# Patient Record
Sex: Female | Born: 1990 | Race: Black or African American | Hispanic: No | Marital: Single | Smoking: Never smoker
Health system: Southern US, Community
[De-identification: ages and names within clinical notes are randomized; demographics above are authoritative.]

---

## 2013-06-14 ENCOUNTER — Emergency Department (HOSPITAL_COMMUNITY)
Admission: EM | Admit: 2013-06-14 | Discharge: 2013-06-14 | Disposition: A | Discharge status: Home / Self Care | Payer: Self-pay | Attending: Emergency Medicine | Admitting: Emergency Medicine

## 2013-06-14 ENCOUNTER — Encounter (HOSPITAL_COMMUNITY): Payer: Self-pay | Admitting: *Deleted

## 2013-06-14 ENCOUNTER — Emergency Department (HOSPITAL_COMMUNITY): Payer: Self-pay

## 2013-06-14 DIAGNOSIS — Y9239 Other specified sports and athletic area as the place of occurrence of the external cause: Secondary | ICD-10-CM | POA: Insufficient documentation

## 2013-06-14 DIAGNOSIS — S92309A Fracture of unspecified metatarsal bone(s), unspecified foot, initial encounter for closed fracture: Secondary | ICD-10-CM | POA: Insufficient documentation

## 2013-06-14 DIAGNOSIS — X500XXA Overexertion from strenuous movement or load, initial encounter: Secondary | ICD-10-CM | POA: Insufficient documentation

## 2013-06-14 DIAGNOSIS — Y9367 Activity, basketball: Secondary | ICD-10-CM | POA: Insufficient documentation

## 2013-06-14 DIAGNOSIS — S92352A Displaced fracture of fifth metatarsal bone, left foot, initial encounter for closed fracture: Secondary | ICD-10-CM

## 2013-06-14 MED ORDER — TRAMADOL HCL 50 MG PO TABS: 50.0000 mg | ORAL_TABLET | Freq: Four times a day (QID) | ORAL | Status: DC | PRN

## 2013-06-14 NOTE — ED Notes (Signed)
Ortho paged. 

## 2013-06-14 NOTE — ED Provider Notes (Signed)
Medical screening examination/treatment/procedure(s) were performed by non-physician practitioner and as supervising physician I was immediately available for consultation/collaboration.   Glynn Octave, MD 06/14/13 2126

## 2013-06-14 NOTE — ED Provider Notes (Signed)
CSN: [id number]     Arrival date & time 06/14/13  1922 History   First MD Initiated Contact with Patient 06/14/13 1932     Chief Complaint  Patient presents with  . Foot Injury   (Consider location/radiation/quality/duration/timing/severity/associated sxs/prior Treatment) HPI Comments: Patient is a 22 year old female who presents with a 3 day history of left foot pain. The mechanism of injury was sudden ankle inversion during basketball practice. Patient reports sudden onset of throbbing, severe pain that is localized to left lateral foot. Patient reports progressive worsening of pain. Ankle movement and weight bearing activity make the pain worse. Nothing makes the pain better. Patient reports associated swelling. She reports not being able to bear weight on the affected foot. Patient has not tried anything for pain relief. Patient denies obvious deformity, numbness/tingling, coolness/weakness of extremity, bruising, and any other injury.      History reviewed. No pertinent past medical history. History reviewed. No pertinent past surgical history. No family history on file. History  Substance Use Topics  . Smoking status: Never Smoker   . Smokeless tobacco: Not on file  . Alcohol Use: No   OB History   Grav Para Term Preterm Abortions TAB SAB Ect Mult Living                 Review of Systems  Musculoskeletal: Positive for arthralgias.  All other systems reviewed and are negative.    Allergies  Review of patient's allergies indicates not on file.  Home Medications  No current outpatient prescriptions on file. BP 151/92  Pulse 68  Temp(Src) 97.9 F (36.6 C) (Oral)  Resp 16  SpO2 100%  LMP 06/07/2013 Physical Exam  Nursing note and vitals reviewed. Constitutional: She appears well-developed and well-nourished. No distress.  HENT:  Head: Normocephalic and atraumatic.  Eyes: Conjunctivae are normal.  Neck: Normal range of motion.  Cardiovascular: Normal rate, regular  rhythm and intact distal pulses.  Exam reveals no gallop and no friction rub.   No murmur heard. Pulmonary/Chest: Effort normal and breath sounds normal. She has no wheezes. She has no rales. She exhibits no tenderness.  Musculoskeletal: Normal range of motion.  Left lateral foot swelling and tenderness to palpation over 5th proximal metatarsal bone.   Neurological: She is alert. Coordination normal.  Speech is goal-oriented. Moves limbs without ataxia.   Skin: Skin is warm and dry.  Psychiatric: She has a normal mood and affect. Her behavior is normal.    ED Course  Procedures (including critical care time) Labs Review Labs Reviewed - No data to display Imaging Review Dg Foot Complete Left  06/14/2013   *RADIOLOGY REPORT*  Clinical Data: Lateral foot pain.  Jumping injury 3 days ago.  LEFT FOOT - COMPLETE 3+ VIEW  Comparison: None.  Findings: There is a Jones fracture of the fifth metatarsal which is nondisplaced.  No distraction of the fracture.  This is better seen on the oblique view than on the frontal view.  There is lateral soft tissue swelling, pronounced over the lateral fifth MTP joint.  IMPRESSION: Fifth metatarsal Jones fracture.   Original Report Authenticated By: Andreas Newport, M.D.    MDM   1. Fracture of fifth metatarsal bone of left foot     7:32 PM Foot xray pending. No neurovascular compromise. Patient will have Tramadol for pain.   8:15 PM Patient's xray shows fifth metatarsal jones fracture. Patient will have crutches, posterior ankle splint and follow up with [facility]. No neurovascular compromise.  Emilia Beck, PA-C 06/14/13 2022

## 2013-06-14 NOTE — ED Notes (Signed)
The pt injured her lt foot Wednesday playing basketball.  Pain since then she walked in

## 2013-06-14 NOTE — Progress Notes (Signed)
Orthopedic Tech Progress Note Patient Details:  Beth Lindsey 1991-06-28 [id number]  Ortho Devices Type of Ortho Device: Ace wrap;Crutches;Post (short leg) splint Ortho Device/Splint Location: LLE Ortho Device/Splint Interventions: Ordered;Application   [name] 06/14/2013, 8:40 PM

## 2013-08-18 ENCOUNTER — Encounter (HOSPITAL_COMMUNITY): Payer: Self-pay | Admitting: Emergency Medicine

## 2013-08-18 ENCOUNTER — Emergency Department (HOSPITAL_COMMUNITY)
Admission: EM | Admit: 2013-08-18 | Discharge: 2013-08-18 | Disposition: A | Discharge status: Home / Self Care | Payer: Self-pay | Attending: Emergency Medicine | Admitting: Emergency Medicine

## 2013-08-18 DIAGNOSIS — R05 Cough: Secondary | ICD-10-CM | POA: Insufficient documentation

## 2013-08-18 DIAGNOSIS — Z79899 Other long term (current) drug therapy: Secondary | ICD-10-CM | POA: Insufficient documentation

## 2013-08-18 DIAGNOSIS — R059 Cough, unspecified: Secondary | ICD-10-CM | POA: Insufficient documentation

## 2013-08-18 MED ORDER — ALBUTEROL SULFATE HFA 108 (90 BASE) MCG/ACT IN AERS
2.0000 | INHALATION_SPRAY | Freq: Once | RESPIRATORY_TRACT | Status: AC
  Administered 2013-08-18: 2 via RESPIRATORY_TRACT
  Filled 2013-08-18: qty 6.7

## 2013-08-18 NOTE — ED Provider Notes (Signed)
CSN: [id number]     Arrival date & time 08/18/13  1848 History   None    This chart was scribed for Clyde Canterbury, by Ladona Ridgel Day, ED scribe. This patient was seen in room TR04C/TR04C and the patient's care was started at 1848.  Chief Complaint  Patient presents with  . Cough   Patient is a 22 y.o. female presenting with cough. The history is provided by the patient. No language interpreter was used.  Cough Cough characteristics:  Harsh Severity:  Moderate Onset quality:  Gradual Duration:  8 weeks Timing:  Constant Progression:  Worsening Chronicity:  New Smoker: no   Context: smoke exposure   Relieved by:  Nothing Worsened by:  Nothing tried Associated symptoms: no chills, no fever and no shortness of breath    HPI Comments: Beth Lindsey is a 22 y.o. female who presents to the Emergency Department complaining of a constant, gradually worsening harsh cough, onset 2 months ago. She states tried taking OTC medicines w/out improvement. She states has not tried using an inhaler for these symptoms. She has not yet seen anyone for this problem. She denies fever/chills, SOB. She states second hand smoke at home but is not a smoker herself.   History reviewed. No pertinent past medical history. History reviewed. No pertinent past surgical history. No family history on file. History  Substance Use Topics  . Smoking status: Never Smoker   . Smokeless tobacco: Not on file  . Alcohol Use: No   OB History   Grav Para Term Preterm Abortions TAB SAB Ect Mult Living                 Review of Systems  Constitutional: Negative for fever and chills.  Respiratory: Positive for cough. Negative for shortness of breath.   Gastrointestinal: Negative for nausea and vomiting.  Neurological: Negative for weakness.  All other systems reviewed and are negative.   A complete 10 system review of systems was obtained and all systems are negative except as noted in the HPI and PMH.   Allergies   Review of patient's allergies indicates no known allergies.  Home Medications   Current Outpatient Rx  Name  Route  Sig  Dispense  Refill  . ibuprofen (ADVIL,MOTRIN) 200 MG tablet   Oral   Take 400 mg by mouth every 6 (six) hours as needed for pain.         . Pseudoeph-Doxylamine-DM-APAP (NYQUIL PO)   Oral   Take 1 capsule by mouth daily.         . traMADol (ULTRAM) 50 MG tablet   Oral   Take 1 tablet (50 mg total) by mouth every 6 (six) hours as needed for pain.   20 tablet   0    Triage Vitals: BP 120/79  Pulse 75  Temp(Src) 97.8 F (36.6 C) (Oral)  Resp 16  Ht 6\' 1"  (1.854 m)  Wt 135 lb (61.236 kg)  BMI 17.82 kg/m2  SpO2 100%  LMP 08/01/2013 Physical Exam  Nursing note and vitals reviewed. Constitutional: She is oriented to person, place, and time. She appears well-developed and well-nourished. No distress.  HENT:  Head: Normocephalic and atraumatic.  Eyes: EOM are normal.  Neck: Neck supple. No tracheal deviation present.  Cardiovascular: Normal rate, regular rhythm and normal heart sounds.   No murmur heard. Pulmonary/Chest: Effort normal. No respiratory distress.  Musculoskeletal: Normal range of motion.  Neurological: She is alert and oriented to person, place, and time.  Skin: Skin is warm and dry.  Psychiatric: She has a normal mood and affect. Her behavior is normal.    ED Course  Procedures (including critical care time) DIAGNOSTIC STUDIES: Oxygen Saturation is 100% on room air, normal by my interpretation.    COORDINATION OF CARE: At 810 PM Discussed treatment plan with patient which includes proventil. Patient agrees.   Labs Review Labs Reviewed - No data to display Imaging Review No results found.  EKG Interpretation   None       MDM   1. Cough    No fever, cough, chills, recent illness, difficulty breathing or shortness of breath. Well-appearing and benign exam. No history of asthma. Describes this cough as feeling irritated  and worsening at night time. Encouraged to use humidifier at night and use of albuterol inhaler as needed. Follow-up with [facility].   I personally performed the services described in this documentation, which was scribed in my presence. The recorded information has been reviewed and is accurate.      Irish Elders, NP 08/19/13 [phone]

## 2013-08-18 NOTE — ED Notes (Signed)
Pt states she has had a cough for 2 months and has tried nyquil. States that it did not help. States that she coughs up a small amount of mucous.

## 2013-08-18 NOTE — ED Notes (Signed)
The pt is c/o a cough for 2 months.  No temp taking over-the-counter meds that are not helping

## 2013-08-19 NOTE — ED Provider Notes (Signed)
Medical screening examination/treatment/procedure(s) were performed by non-physician practitioner and as supervising physician I was immediately available for consultation/collaboration.  EKG Interpretation   None         Josette Shimabukuro E Edman Lipsey, MD 08/19/13 1542 

## 2013-08-27 ENCOUNTER — Emergency Department (HOSPITAL_COMMUNITY): Payer: Self-pay

## 2013-08-27 ENCOUNTER — Encounter (HOSPITAL_COMMUNITY): Payer: Self-pay | Admitting: Emergency Medicine

## 2013-08-27 ENCOUNTER — Emergency Department (HOSPITAL_COMMUNITY)
Admission: EM | Admit: 2013-08-27 | Discharge: 2013-08-27 | Disposition: A | Discharge status: Home / Self Care | Payer: Self-pay | Attending: Emergency Medicine | Admitting: Emergency Medicine

## 2013-08-27 DIAGNOSIS — IMO0001 Reserved for inherently not codable concepts without codable children: Secondary | ICD-10-CM | POA: Insufficient documentation

## 2013-08-27 DIAGNOSIS — Z79899 Other long term (current) drug therapy: Secondary | ICD-10-CM | POA: Insufficient documentation

## 2013-08-27 DIAGNOSIS — X500XXA Overexertion from strenuous movement or load, initial encounter: Secondary | ICD-10-CM | POA: Insufficient documentation

## 2013-08-27 DIAGNOSIS — S8990XA Unspecified injury of unspecified lower leg, initial encounter: Secondary | ICD-10-CM | POA: Insufficient documentation

## 2013-08-27 DIAGNOSIS — Y9367 Activity, basketball: Secondary | ICD-10-CM | POA: Insufficient documentation

## 2013-08-27 DIAGNOSIS — Y9239 Other specified sports and athletic area as the place of occurrence of the external cause: Secondary | ICD-10-CM | POA: Insufficient documentation

## 2013-08-27 DIAGNOSIS — S92302D Fracture of unspecified metatarsal bone(s), left foot, subsequent encounter for fracture with routine healing: Secondary | ICD-10-CM

## 2013-08-27 DIAGNOSIS — M79672 Pain in left foot: Secondary | ICD-10-CM

## 2013-08-27 NOTE — ED Notes (Signed)
Patient fractured her foot in august.  States that she was just cleared and injured her foot again yesterday. C/O pain and swelling in her left lateral foot.  RN noted swelling in her left lateral foot.  CMS intact

## 2013-08-27 NOTE — ED Provider Notes (Signed)
CSN: [id number]     Arrival date & time 08/27/13  1806 History   First MD Initiated Contact with Patient 08/27/13 2041    This chart was scribed for Dahlia Client Muthersbaug PA-C, a non-physician practitioner working with Enid Skeens, MD by Lewanda Rife, ED Scribe. This patient was seen in room TR08C/TR08C and the patient's care was started at 9:37 PM     Chief Complaint  Patient presents with  . Foot Pain   (Consider location/radiation/quality/duration/timing/severity/associated sxs/prior Treatment) The history is provided by the patient and medical records. No language interpreter was used.   HPI Comments: Beth Lindsey is a 22 y.o. female who presents to the Emergency Department complaining of intermittent moderate lateral left foot pain onset yesterday after "landing wrong" while playing basketball. Denies associated recent fall, other injuries, numbness,  Reports pain is exacerbated by weight bearing. Reports pain is alleviated by ibuprofen. Reports PMHx of fifth metatarsal fx of left foot in 05/2013, she was cleared by orthopedist to return to basketball. The patient reports she presents today because of the pain. She states the pain is less than when she initially for her foot.   History reviewed. No pertinent past medical history. History reviewed. No pertinent past surgical history. History reviewed. No pertinent family history. History  Substance Use Topics  . Smoking status: Never Smoker   . Smokeless tobacco: Not on file  . Alcohol Use: No   OB History   Grav Para Term Preterm Abortions TAB SAB Ect Mult Living                 Review of Systems  Constitutional: Negative for fever and chills.  Gastrointestinal: Negative for nausea and vomiting.  Musculoskeletal: Positive for arthralgias and gait problem (2/2 pain). Negative for back pain, joint swelling, neck pain and neck stiffness.       Foot pain   Skin: Negative for wound.  Neurological: Negative for  numbness.  Hematological: Does not bruise/bleed easily.  Psychiatric/Behavioral: The patient is not nervous/anxious.   All other systems reviewed and are negative.   A complete 10 system review of systems was obtained and all systems are negative except as noted in the HPI and PMHx.    Allergies  Review of patient's allergies indicates no known allergies.  Home Medications   Current Outpatient Rx  Name  Route  Sig  Dispense  Refill  . albuterol (PROVENTIL HFA;VENTOLIN HFA) 108 (90 BASE) MCG/ACT inhaler   Inhalation   Inhale 2 puffs into the lungs every 6 (six) hours as needed for wheezing or shortness of breath.         Marland Kitchen ibuprofen (ADVIL,MOTRIN) 200 MG tablet   Oral   Take 400 mg by mouth every 6 (six) hours as needed for pain.          BP 115/72  Pulse 60  Temp(Src) 98 F (36.7 C) (Oral)  Resp 18  Ht 6' (1.829 m)  Wt 131 lb 12.8 oz (59.784 kg)  BMI 17.87 kg/m2  SpO2 99%  LMP 08/27/2013 Physical Exam  Nursing note and vitals reviewed. Constitutional: She is oriented to person, place, and time. She appears well-developed and well-nourished. No distress.  Awake, alert, nontoxic appearance  HENT:  Head: Normocephalic and atraumatic.  Mouth/Throat: Oropharynx is clear and moist. No oropharyngeal exudate.  Eyes: Conjunctivae are normal. No scleral icterus.  Neck: Normal range of motion. Neck supple.  Cardiovascular: Normal rate, regular rhythm, normal heart sounds and intact distal pulses.  No murmur heard. Capillary refill less than 3 seconds  Pulmonary/Chest: Effort normal and breath sounds normal. No respiratory distress. She has no wheezes.  Musculoskeletal: Normal range of motion. She exhibits no edema and no tenderness.  ROM: Will range of motion of all joints of the left lower extremity  Mild swelling of the left fifth metatarsal with mild ecchymosis - pt reports unchanged since initial break  Neurological: She is alert and oriented to person, place, and  time. She has normal strength. No sensory deficit. Coordination normal.  Speech is clear and goal oriented Moves extremities without ataxia  5/5 Strength of left foot including dorsiflexion and plantar flexion against resistance Sensation intact    Skin: Skin is warm and dry. She is not diaphoretic. No erythema.  No tenting of the skin  Psychiatric: She has a normal mood and affect.    ED Course  Procedures (including critical care time)  COORDINATION OF CARE:  Nursing notes reviewed. Vital signs reviewed. Initial pt interview and examination performed.   9:37 PM Nursing Notes Reviewed/ Care Coordinated Applicable Imaging Reviewed and incorporated into ED treatment Discussed results and treatment plan with pt. Pt demonstrates understanding and agrees with plan.   Treatment plan initiated:Medications - No data to display   Initial diagnostic testing ordered.    Labs Review Labs Reviewed - No data to display Imaging Review Dg Foot Complete Left  08/27/2013   CLINICAL DATA:  Pain, history of 5th metatarsal fracture  EXAM: LEFT FOOT - COMPLETE 3+ VIEW  COMPARISON:  06/14/2013  FINDINGS: Healing fracture involving the proximal 5th metatarsal. Fracture lucency remains visible.  The joint spaces are preserved.  Visualized soft tissues are grossly unremarkable.  IMPRESSION: Healing fracture involving the proximal 5th metatarsal. Fracture lucency remains visible.   Electronically Signed   By: Charline Bills M.D.   On: 08/27/2013 19:16    EKG Interpretation   None       MDM   1. Foot pain, left   2. Fracture of 5th metatarsal, left, with routine healing, subsequent encounter      Beth Lindsey presents with foot pain after playing basketball.  Patient X-Ray negative for obvious new fracture or dislocation. Healing 5th metatarsal fractured noted.  I personally reviewed the imaging tests through PACS system. I reviewed available ER/hospitalization records through the EMR.   Pain managed in ED. Pt advised to follow up with orthopedics if symptoms persist. Patient instructed to wear her postop shoe for one more week, conservative therapy recommended and discussed. Patient will be dc home & is agreeable with above plan.  She reports she has pain medication at home.  It has been determined that no acute conditions requiring further emergency intervention are present at this time. The patient/guardian have been advised of the diagnosis and plan. We have discussed signs and symptoms that warrant return to the ED, such as changes or worsening in symptoms.   Vital signs are stable at discharge.   BP 115/72  Pulse 60  Temp(Src) 98 F (36.7 C) (Oral)  Resp 18  Ht 6' (1.829 m)  Wt 131 lb 12.8 oz (59.784 kg)  BMI 17.87 kg/m2  SpO2 99%  LMP 08/27/2013  Patient/guardian has voiced understanding and agreed to follow-up with the PCP or specialist.    I personally performed the services described in this documentation, which was scribed in my presence. The recorded information has been reviewed and is accurate.    Dierdre Forth, PA-C 08/27/13 2138

## 2013-08-27 NOTE — ED Notes (Signed)
Pt c/o left foot pain after landing wrong while playing basketball yesterday; pt sts hx of fracture in same foot in August

## 2013-08-28 NOTE — ED Provider Notes (Signed)
Medical screening examination/treatment/procedure(s) were performed by non-physician practitioner and as supervising physician I was immediately available for consultation/collaboration.  EKG Interpretation   None         Enid Skeens, MD 08/28/13 0111

## 2014-02-02 ENCOUNTER — Encounter (HOSPITAL_COMMUNITY): Payer: Self-pay | Admitting: Emergency Medicine

## 2014-02-02 ENCOUNTER — Emergency Department (HOSPITAL_COMMUNITY)
Admission: EM | Admit: 2014-02-02 | Discharge: 2014-02-02 | Disposition: A | Discharge status: Home / Self Care | Payer: BC Managed Care – PPO | Attending: Emergency Medicine | Admitting: Emergency Medicine

## 2014-02-02 DIAGNOSIS — L2989 Other pruritus: Secondary | ICD-10-CM | POA: Insufficient documentation

## 2014-02-02 DIAGNOSIS — N898 Other specified noninflammatory disorders of vagina: Secondary | ICD-10-CM

## 2014-02-02 DIAGNOSIS — N390 Urinary tract infection, site not specified: Secondary | ICD-10-CM | POA: Insufficient documentation

## 2014-02-02 DIAGNOSIS — L298 Other pruritus: Secondary | ICD-10-CM | POA: Insufficient documentation

## 2014-02-02 LAB — WET PREP, GENITAL
Clue Cells Wet Prep HPF POC: NONE SEEN
Trich, Wet Prep: NONE SEEN
YEAST WET PREP: NONE SEEN

## 2014-02-02 LAB — POC URINE PREG, ED: PREG TEST UR: NEGATIVE

## 2014-02-02 LAB — URINALYSIS, ROUTINE W REFLEX MICROSCOPIC
BILIRUBIN URINE: NEGATIVE
Glucose, UA: NEGATIVE mg/dL
Hgb urine dipstick: NEGATIVE
KETONES UR: NEGATIVE mg/dL
LEUKOCYTES UA: NEGATIVE
NITRITE: NEGATIVE
PH: 6.5 (ref 5.0–8.0)
PROTEIN: NEGATIVE mg/dL
Specific Gravity, Urine: 1.018 (ref 1.005–1.030)
UROBILINOGEN UA: 1 mg/dL (ref 0.0–1.0)

## 2014-02-02 NOTE — ED Notes (Signed)
Pt states she had burning and itchy but used cranberry juice and that helped with the pain. Pt denies vaginal discharge but concerned for yeast.

## 2014-02-02 NOTE — ED Provider Notes (Signed)
Medical screening examination/treatment/procedure(s) were performed by non-physician practitioner and as supervising physician I was immediately available for consultation/collaboration.   EKG Interpretation None        Kimmie Berggren, MD 02/02/14 2338 

## 2014-02-02 NOTE — ED Provider Notes (Signed)
CSN: [id number]     Arrival date & time 02/02/14  1921 History   First MD Initiated Contact with Patient 02/02/14 1930     Chief Complaint  Patient presents with  . Urinary Tract Infection  . Vaginitis     (Consider location/radiation/quality/duration/timing/severity/associated sxs/prior Treatment) HPI Beth Lindsey This 23 year old female with no significant past medical history presents to the emergency department with chief complaint of vaginal itching.  The patient states that she had unprotected sexual intercourse 2 weeks ago.  She experienced some minor vaginal itching without discharge, dyspareunia or foul odor.  She denies any urinary symptoms, last menstrual period was the first of this month.  Patient denies any abdominal pain, nausea, vomiting.  She states she "just wanted to get checked out."  History reviewed. No pertinent past medical history. History reviewed. No pertinent past surgical history. History reviewed. No pertinent family history. History  Substance Use Topics  . Smoking status: Never Smoker   . Smokeless tobacco: Not on file  . Alcohol Use: No   OB History   Grav Para Term Preterm Abortions TAB SAB Ect Mult Living                 Review of Systems  Ten systems reviewed and are negative for acute change, except as noted in the HPI.    Allergies  Review of patient's allergies indicates no known allergies.  Home Medications   Prior to Admission medications   Not on File   BP 132/84  Pulse 63  Temp(Src) 97.7 F (36.5 C) (Oral)  Resp 16  Ht 6' (1.829 m)  Wt 133 lb 1 oz (60.357 kg)  BMI 18.04 kg/m2  SpO2 100% Physical Exam Physical Exam  Nursing note and vitals reviewed. Constitutional: She is oriented to person, place, and time. She appears well-developed and well-nourished. No distress.  HENT:  Head: Normocephalic and atraumatic.  Eyes: Conjunctivae normal and EOM are normal. Pupils are equal, round, and reactive to light. No scleral  icterus.  Neck: Normal range of motion.  Cardiovascular: Normal rate, regular rhythm and normal heart sounds.  Exam reveals no gallop and no friction rub.   No murmur heard. Pulmonary/Chest: Effort normal and breath sounds normal. No respiratory distress.  Abdominal: Soft. Bowel sounds are normal. She exhibits no distension and no mass. There is no tenderness. There is no guarding.  Neurological: She is alert and oriented to person, place, and time.  Skin: Skin is warm and dry. She is not diaphoretic.  Pelvic exam: normal external genitalia, vulva, vagina, cervix, uterus and adnexa.   ED Course  Procedures (including critical care time) Labs Review Labs Reviewed  WET PREP, GENITAL - Abnormal; Notable for the following:    WBC, Wet Prep HPF POC MANY (*)    All other components within normal limits  GC/CHLAMYDIA PROBE AMP  URINALYSIS, ROUTINE W REFLEX MICROSCOPIC  POC URINE PREG, ED    Imaging Review No results found.   EKG Interpretation None      MDM   Final diagnoses:  None    Patient urinalysis negative for any abnormality.  Negative pregnancy test, wet prep is negative.  GC Chlamydia is pending.  Benign pelvic exam without concern for infectious process.  Patient will be discharged.   follow up at health department if she is concerned for other STDs such as syphilis or HIV.    Arthor CaptainAbigail Reason Helzer, PA-C 02/02/14 2226

## 2014-02-02 NOTE — Discharge Instructions (Signed)
Your labs showed no abnormalities. If you feel you need further testing please follow up at the [facility] for HIV and Syphilis testing.  Vaginitis Vaginitis is an inflammation of the vagina. It is most often caused by a change in the normal balance of the bacteria and yeast that live in the vagina. This change in balance causes an overgrowth of certain bacteria or yeast, which causes the inflammation. There are different types of vaginitis, but the most common types are:  Bacterial vaginosis.  Yeast infection (candidiasis).  Trichomoniasis vaginitis. This is a sexually transmitted infection (STI).  Viral vaginitis.  Atropic vaginitis.  Allergic vaginitis. CAUSES  The cause depends on the type of vaginitis. Vaginitis can be caused by:  Bacteria (bacterial vaginosis).  Yeast (yeast infection).  A parasite (trichomoniasis vaginitis)  A virus (viral vaginitis).  Low hormone levels (atrophic vaginitis). Low hormone levels can occur during pregnancy, breastfeeding, or after menopause.  Irritants, such as bubble baths, scented tampons, and feminine sprays (allergic vaginitis). Other factors can change the normal balance of the yeast and bacteria that live in the vagina. These include:  Antibiotic medicines.  Poor hygiene.  Diaphragms, vaginal sponges, spermicides, birth control pills, and intrauterine devices (IUD).  Sexual intercourse.  Infection.  Uncontrolled diabetes.  A weakened immune system. SYMPTOMS  Symptoms can vary depending on the cause of the vaginitis. Common symptoms include:  Abnormal vaginal discharge.  The discharge is white, gray, or yellow with bacterial vaginosis.  The discharge is thick, white, and cheesy with a yeast infection.  The discharge is frothy and yellow or greenish with trichomoniasis.  A bad vaginal odor.  The odor is fishy with bacterial vaginosis.  Vaginal itching, pain, or swelling.  Painful  intercourse.  Pain or burning when urinating. Sometimes, there are no symptoms. TREATMENT  Treatment will vary depending on the type of infection.   Bacterial vaginosis and trichomoniasis are often treated with antibiotic creams or pills.  Yeast infections are often treated with antifungal medicines, such as vaginal creams or suppositories.  Viral vaginitis has no cure, but symptoms can be treated with medicines that relieve discomfort. Your sexual partner should be treated as well.  Atrophic vaginitis may be treated with an estrogen cream, pill, suppository, or vaginal ring. If vaginal dryness occurs, lubricants and moisturizing creams may help. You may be told to avoid scented soaps, sprays, or douches.  Allergic vaginitis treatment involves quitting the use of the product that is causing the problem. Vaginal creams can be used to treat the symptoms. HOME CARE INSTRUCTIONS   Take all medicines as directed by your caregiver.  Keep your genital area clean and dry. Avoid soap and only rinse the area with water.  Avoid douching. It can remove the healthy bacteria in the vagina.  Do not use tampons or have sexual intercourse until your vaginitis has been treated. Use sanitary pads while you have vaginitis.  Wipe from front to back. This avoids the spread of bacteria from the rectum to the vagina.  Let air reach your genital area.  Wear cotton underwear to decrease moisture buildup.  Avoid wearing underwear while you sleep until your vaginitis is gone.  Avoid tight pants and underwear or nylons without a cotton panel.  Take off wet clothing (especially bathing suits) as soon as possible.  Use mild, non-scented products. Avoid using irritants, such as:  Scented feminine sprays.  Fabric softeners.  Scented detergents.  Scented tampons.  Scented soaps or bubble baths.  Practice safe  sex and use condoms. Condoms may prevent the spread of trichomoniasis and viral  vaginitis. SEEK MEDICAL CARE IF:   You have abdominal pain.  You have a fever or persistent symptoms for more than 2 3 days.  You have a fever and your symptoms suddenly get worse. Document Released: 07/31/2007 Document Revised: 06/27/2012 Document Reviewed: 03/15/2012 [facility] Patient Information 2014 [place], [place].

## 2014-02-02 NOTE — ED Notes (Signed)
PA at bedside.

## 2014-02-03 LAB — GC/CHLAMYDIA PROBE AMP
CT Probe RNA: NEGATIVE
GC Probe RNA: NEGATIVE

## 2014-04-23 ENCOUNTER — Ambulatory Visit (INDEPENDENT_AMBULATORY_CARE_PROVIDER_SITE_OTHER): Payer: BC Managed Care – PPO | Admitting: Family Medicine

## 2014-04-23 VITALS — BP 98/60 | HR 60 | Temp 98.7°F | Resp 12 | Ht 71.0 in | Wt 134.0 lb

## 2014-04-23 DIAGNOSIS — Z0000 Encounter for general adult medical examination without abnormal findings: Secondary | ICD-10-CM

## 2014-04-23 DIAGNOSIS — Z3009 Encounter for other general counseling and advice on contraception: Secondary | ICD-10-CM

## 2014-04-23 DIAGNOSIS — Z23 Encounter for immunization: Secondary | ICD-10-CM

## 2014-04-23 LAB — CBC
HCT: 39.4 % (ref 36.0–46.0)
Hemoglobin: 13.9 g/dL (ref 12.0–15.0)
MCH: 30.3 pg (ref 26.0–34.0)
MCHC: 35.3 g/dL (ref 30.0–36.0)
MCV: 86 fL (ref 78.0–100.0)
PLATELETS: 231 10*3/uL (ref 150–400)
RBC: 4.58 MIL/uL (ref 3.87–5.11)
RDW: 14.5 % (ref 11.5–15.5)
WBC: 6.3 10*3/uL (ref 4.0–10.5)

## 2014-04-23 LAB — POCT URINE PREGNANCY: PREG TEST UR: NEGATIVE

## 2014-04-23 MED ORDER — MEDROXYPROGESTERONE ACETATE 150 MG/ML IM SUSP
150.0000 mg | Freq: Once | INTRAMUSCULAR | Status: AC
  Administered 2014-04-23: 150 mg via INTRAMUSCULAR

## 2014-04-23 NOTE — Patient Instructions (Signed)
Good to see you today.  I will mail you a copy of your lab results. Be sure to find out where you can get your next depo shot BEFORE you are due, and take a home pregnancy test in about 2 weeks.    If you want me to fill in your other immunizations on your form just bring it in for me- otherwise just turn in a copy of your immunizations with your form.  Be sure to keep a copy of your shot record at home with your important papers

## 2014-04-23 NOTE — Progress Notes (Addendum)
[facility] [address], [place] [id number] [phone]  Date:  04/23/2014   Name:  Beth Lindsey   DOB:  10/10/1991   MRN:  [id number]  PCP:  No PCP Per Patient    Chief Complaint: Immunizations and Contraception   History of Present Illness:  Beth Lindsey is a 23 y.o. 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    She also is interested in contraception. She has never used contraception in the past.   Her menstrual cycles are monthly.   Her LMP was 2 weeks ago.    She is generally helathy, not a smoker.  No history of HTN, no history of DVT or PE, no migraine history.   She last had sex about one week ago.  She did use a condom.  No unprotected sex since her last menses She has had a pap and it was normal per her recollection.  She would like to use depo- provera for her contraception.  She does not have a shot record,but has not had any shots in several years.  She is not sure if she might have had a tetanus or meningitis shot but does not think so.    There are no active problems to display for this patient.   No past medical history on file.  No past surgical history on file.  History  Substance Use Topics  . Smoking status: Never Smoker   . Smokeless tobacco: Not on file  . Alcohol Use: No    No family history on file.  No Known Allergies  Medication list has been reviewed and updated.  No current outpatient prescriptions on file prior to visit.   No current facility-administered medications on file prior to visit.    Review of Systems:  As per HPI- otherwise negative.   Physical Examination: Filed Vitals:   04/23/14 1055  BP: 98/60  Pulse: 60  Temp: 98.7 F (37.1 C)  Resp: 12   Filed Vitals:   04/23/14 1055  Height: 5\' 11"  (1.803 m)  Weight: 134 lb (60.782 kg)   Body  mass index is 18.7 kg/(m^2). Ideal Body Weight: Weight in (lb) to have BMI = 25: 178.9  GEN: WDWN, NAD, Non-toxic, A & O x 3, looks well HEENT: Atraumatic, Normocephalic. Neck supple. No masses, No LAD.  Bilateral TM wnl, oropharynx normal.  PEERL,EOMI.   Ears and Nose: No external deformity. CV: RRR, No M/G/R. No JVD. No thrill. No extra heart sounds. PULM: CTA B, no wheezes, crackles, rhonchi. No retractions. No resp. distress. No accessory muscle use. ABD: S, NT, ND EXTR: No c/c/e NEURO Normal gait.  PSYCH: Normally interactive. Conversant. Not depressed or anxious appearing.  Calm demeanor.   Results for orders placed in visit on 04/23/14  POCT URINE PREGNANCY      Result Value Ref Range   Preg Test, Ur Negative      Assessment and Plan: Encounter for counseling regarding contraception - Plan: POCT urine pregnancy, medroxyPROGESTERone (DEPO-PROVERA) injection 150 mg  Immunization due - Plan: Tdap vaccine greater than or equal to 7yo IM, Meningococcal conjugate vaccine 4-valent IM  Physical exam - Plan: Sickle cell screen, CBC  Labs pending as above.  She plans to participate in sports so will need a sickle screen.  Did her Tdap and menveo today.  Explained that negative pregnancy test is not conclusive, but depo- provera would not harm any potential fetus. She would like to go ahead and get depo- provera today.   See patient instructions for more details.     Signed Abbe AmsterdamJessica Copland, MD  Received her labs 7/9: Results for orders placed in visit on 04/23/14  SICKLE CELL SCREEN      Result Value Ref Range   Sickle Cell Screen NEG  NEG  CBC      Result Value Ref Range   WBC 6.3  4.0 - 10.5 K/uL   RBC 4.58  3.87 - 5.11 MIL/uL   Hemoglobin 13.9  12.0 - 15.0 g/dL   HCT 82.439.4  23.536.0 - 36.146.0 %   MCV 86.0  78.0 - 100.0 fL   MCH 30.3  26.0 - 34.0 pg   MCHC 35.3  30.0 - 36.0 g/dL   RDW 44.314.5  15.411.5 - 00.815.5 %   Platelets 231  150 - 400 K/uL  POCT URINE PREGNANCY      Result Value  Ref Range   Preg Test, Ur Negative     She would like a copy in the mail

## 2014-04-24 ENCOUNTER — Encounter: Payer: Self-pay | Admitting: Family Medicine

## 2014-04-24 LAB — SICKLE CELL SCREEN: SICKLE CELL SCREEN: NEGATIVE

## 2014-05-27 ENCOUNTER — Telehealth: Payer: Self-pay

## 2014-05-27 NOTE — Telephone Encounter (Signed)
Patient says her immunization records were supposed to be faxed over to Dr. Patsy Lageropland to complete her paperwork.  Have these came yet?  She has to have this done by next week.  [phone]

## 2014-05-30 NOTE — Telephone Encounter (Signed)
No immunization history found on [facility]. Pt does have immunization history in EPIC. Advised pt to have office resend fax to us for completion.

## 2014-05-30 NOTE — Telephone Encounter (Signed)
Pt called back to check on faxed shot records, I told her I did not see anything indicating that we had received them, and to try having her Dr.'s office refax them. Please advise pt if these records make it to Dr. Cyndie Chimeopland's box.

## 2014-09-03 IMAGING — CR DG FOOT COMPLETE 3+V*L*
3 series · 3 of 3 positions shown · non-contrast
Comparison: None.

CLINICAL DATA: Lateral foot pain.  Jumping injury 3 days ago.

LEFT FOOT - COMPLETE 3+ VIEW

[x foot ap left]
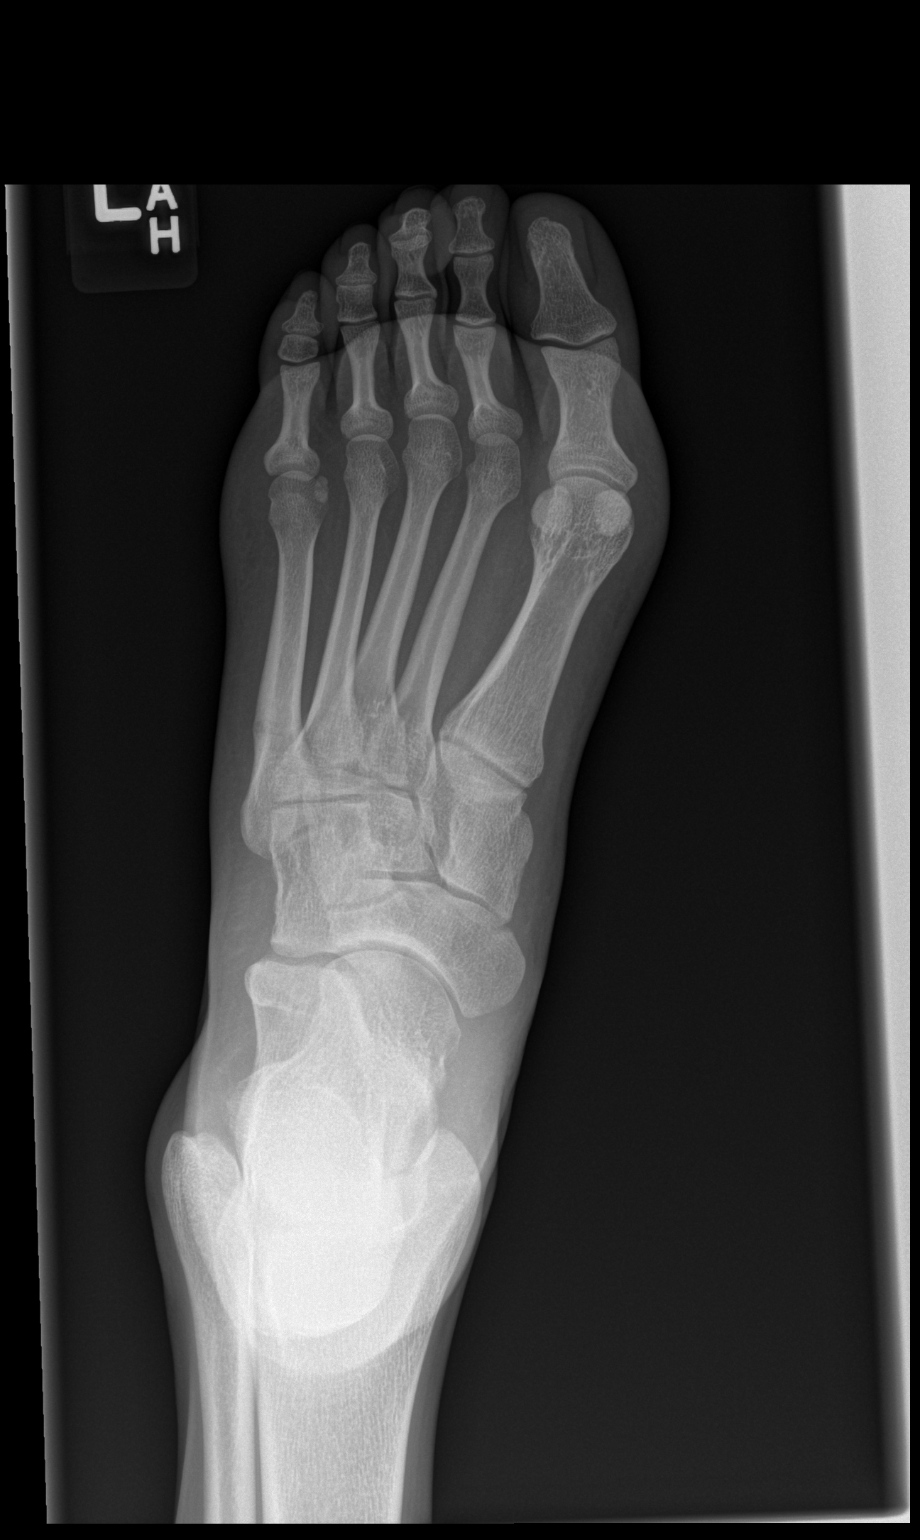

[x foot obl left]
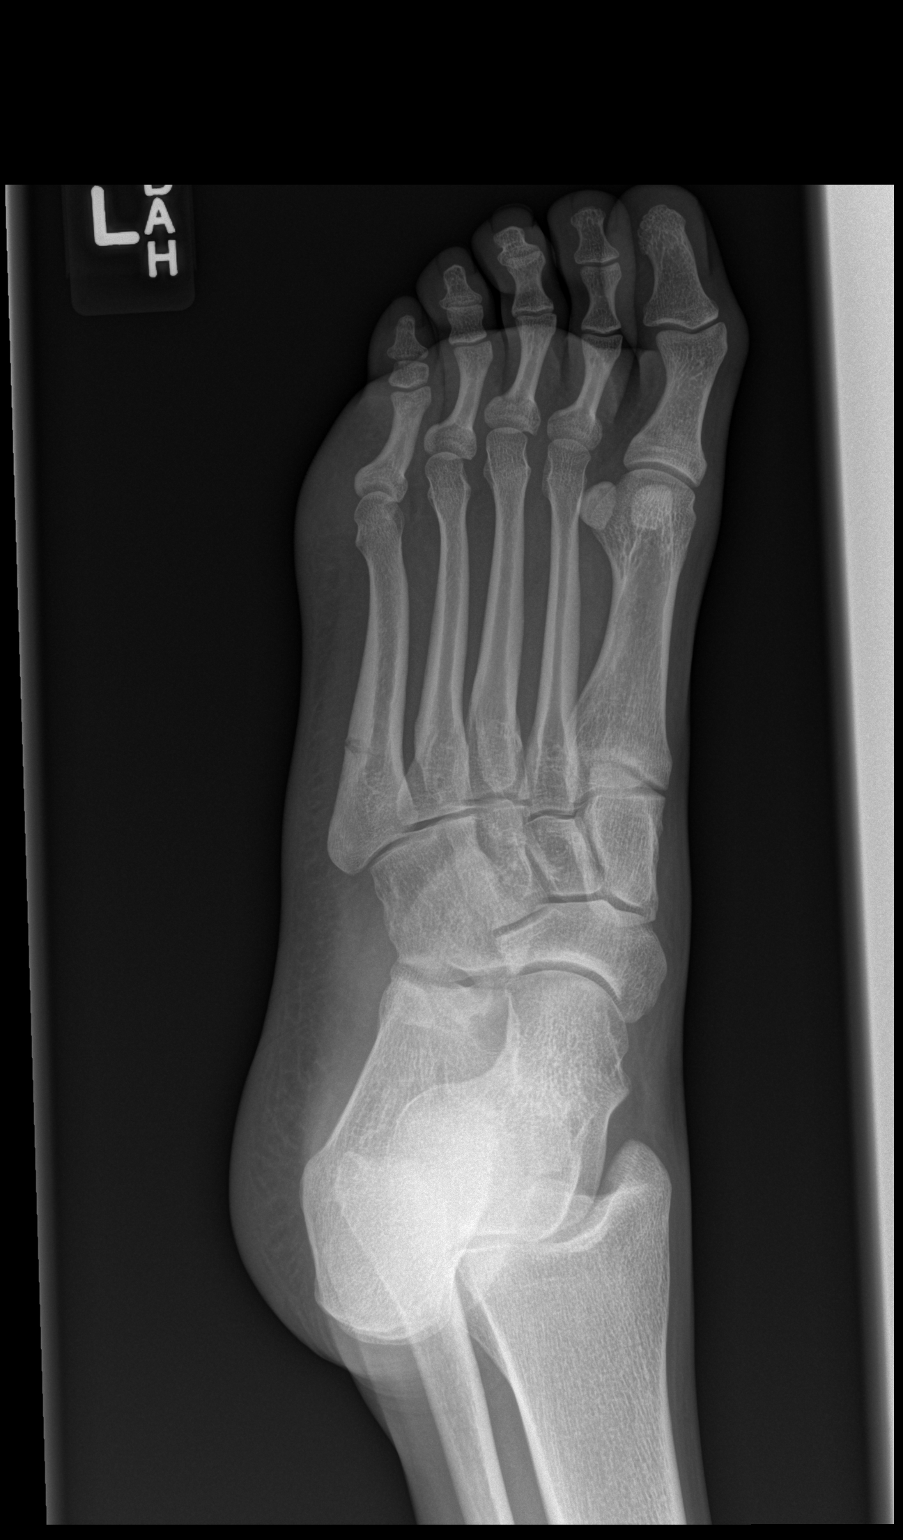

[x foot lat left]
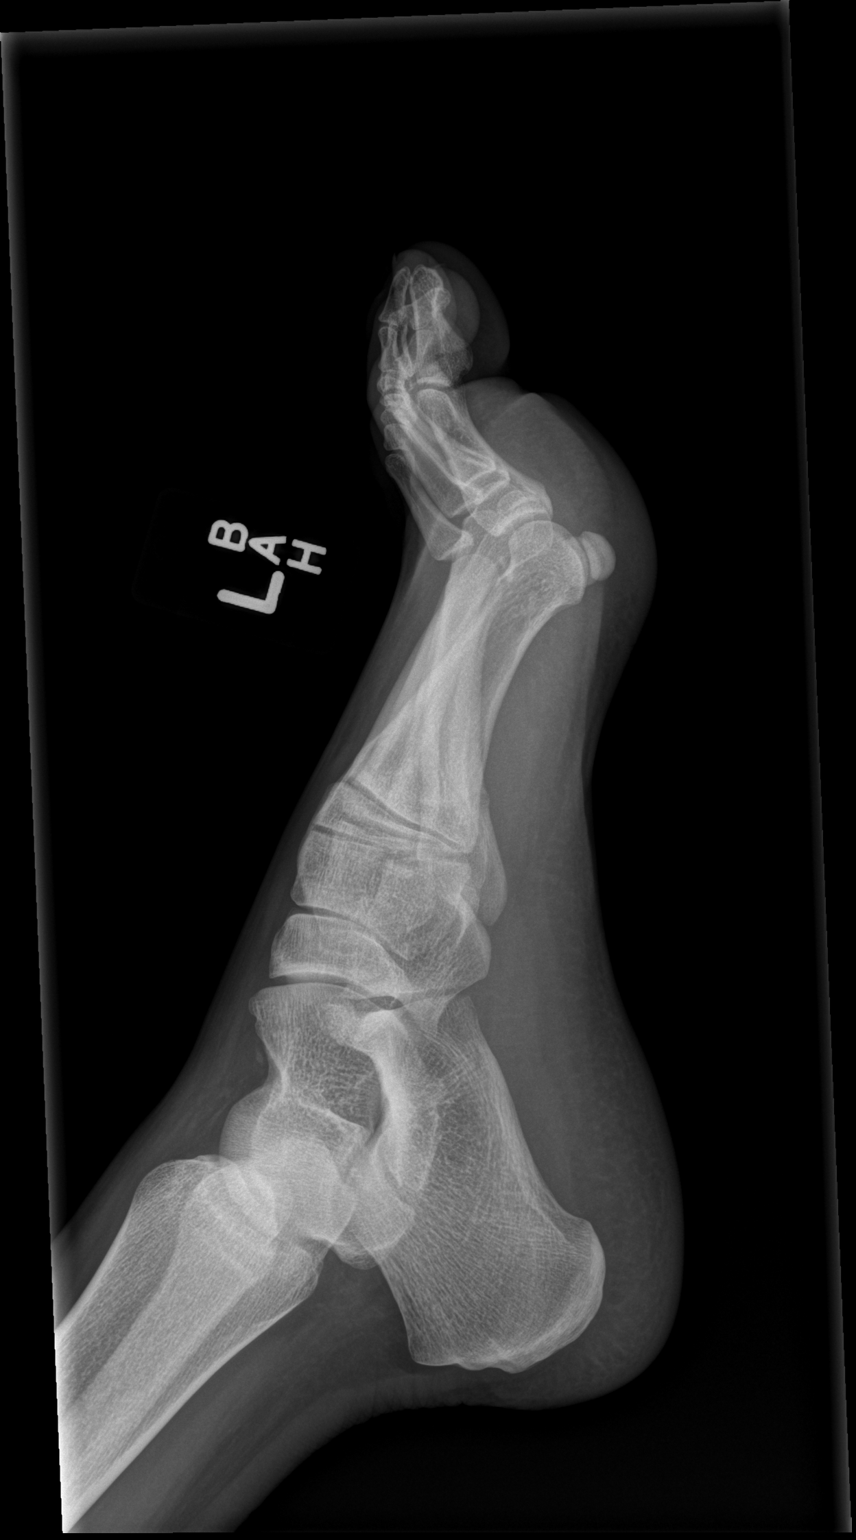

[3 of 3 positions shown; findings below may reference images not displayed]

FINDINGS: There is a Jones fracture of the fifth metatarsal which
is nondisplaced.  No distraction of the fracture.  This is better
seen on the oblique view than on the frontal view.  There is
lateral soft tissue swelling, pronounced over the lateral fifth MTP
joint.
IMPRESSION: Fifth metatarsal Jones fracture.

## 2014-10-08 ENCOUNTER — Emergency Department (HOSPITAL_COMMUNITY)
Admission: EM | Admit: 2014-10-08 | Discharge: 2014-10-08 | Disposition: A | Discharge status: Home / Self Care | Payer: BC Managed Care – PPO | Attending: Emergency Medicine | Admitting: Emergency Medicine

## 2014-10-08 ENCOUNTER — Encounter (HOSPITAL_COMMUNITY): Payer: Self-pay

## 2014-10-08 DIAGNOSIS — A630 Anogenital (venereal) warts: Secondary | ICD-10-CM | POA: Insufficient documentation

## 2014-10-08 NOTE — ED Provider Notes (Signed)
CSN: [id number]     Arrival date & time 10/08/14  1840 History   First MD Initiated Contact with Patient 10/08/14 2034     Chief Complaint  Patient presents with  . Abscess     (Consider location/radiation/quality/duration/timing/severity/associated sxs/prior Treatment) HPI Comments: Patient noticed painless skin growths in perineum several weeks ago waiting until she return from school to have them evaluated   Patient is a 23 y.o. female presenting with abscess. The history is provided by the patient.  Abscess Location:  Ano-genital Ano-genital abscess location:  Perineum Abscess quality: not draining, no fluctuance, no induration, no itching, not painful, no redness, no warmth and not weeping   Red streaking: no   Progression:  Worsening Chronicity:  New Context: not diabetes, not immunosuppression, not injected drug use and not skin injury   Relieved by:  None tried Worsened by:  Nothing tried Ineffective treatments:  None tried Associated symptoms: no fever     History reviewed. No pertinent past medical history. History reviewed. No pertinent past surgical history. History reviewed. No pertinent family history. History  Substance Use Topics  . Smoking status: Never Smoker   . Smokeless tobacco: Not on file  . Alcohol Use: No   OB History    No data available     Review of Systems  Constitutional: Negative for fever.  Genitourinary: Positive for genital sores. Negative for dysuria, vaginal discharge and pelvic pain.  All other systems reviewed and are negative.     Allergies  Review of patient's allergies indicates no known allergies.  Home Medications   Prior to Admission medications   Not on File   BP 111/71 mmHg  Pulse 66  Temp(Src) 97.7 F (36.5 C)  Resp 13  Ht 6' (1.829 m)  Wt 130 lb (58.968 kg)  BMI 17.63 kg/m2  SpO2 98%  LMP 09/24/2014 Physical Exam  Constitutional: She appears well-developed and well-nourished.  HENT:  Head:  Normocephalic.  Eyes: Pupils are equal, round, and reactive to light.  Neck: Normal range of motion.  Cardiovascular: Normal rate.   Pulmonary/Chest: Effort normal.  Genitourinary:     Musculoskeletal: Normal range of motion.  Neurological: She is alert.  Skin: Skin is warm.  Vitals reviewed.   ED Course  Procedures (including critical care time) Labs Review Labs Reviewed - No data to display  Imaging Review No results found.   EKG Interpretation None      MDM   Final diagnoses:  Genital warts         Arman FilterGail K Sheri Gatchel, NP 10/08/14 2105  Joya Gaskinsonald W Wickline, MD 10/09/14 [phone]

## 2014-10-08 NOTE — Discharge Instructions (Signed)
Genital Warts Genital warts are a sexually transmitted infection. They may appear as small bumps on the tissues of the genital area. CAUSES  Genital warts are caused by a virus called human papillomavirus (HPV). HPV is the most common sexually transmitted disease (STD) and infection of the sex organs. This infection is spread by having unprotected sex with an infected person. It can be spread by vaginal, anal, and oral sex. Many people do not know they are infected. They may be infected for years without problems. However, even if they do not have problems, they can unknowingly pass the infection to their sexual partners. SYMPTOMS   Itching and irritation in the genital area.  Warts that bleed.  Painful sexual intercourse. DIAGNOSIS  Warts are usually recognized with the naked eye on the vagina, vulva, perineum, anus, and rectum. Certain tests can also diagnose genital warts, such as:  A Pap test.  A tissue sample (biopsy) exam.  Colposcopy. A magnifying tool is used to examine the vagina and cervix. The HPV cells will change color when certain solutions are used. TREATMENT  Warts can be removed by:  Applying certain chemicals, such as cantharidin or podophyllin.  Liquid nitrogen freezing (cryotherapy).  Immunotherapy with Candida or Trichophyton injections.  Laser treatment.  Burning with an electrified probe (electrocautery).  Interferon injections.  Surgery. PREVENTION  HPV vaccination can help prevent HPV infections that cause genital warts and that cause cancer of the cervix. It is recommended that the vaccination be given to people between the ages 9 to 26 years old. The vaccine might not work as well or might not work at all if you already have HPV. It should not be given to pregnant women. HOME CARE INSTRUCTIONS   It is important to follow your caregiver's instructions. The warts will not go away without treatment. Repeat treatments are often needed to get rid of warts.  Even after it appears that the warts are gone, the normal tissue underneath often remains infected.  Do not try to treat genital warts with medicine used to treat hand warts. This type of medicine is strong and can burn the skin in the genital area, causing more damage.  Tell your past and current sexual partner(s) that you have genital warts. They may be infected also and need treatment.  Avoid sexual contact while being treated.  Do not touch or scratch the warts. The infection may spread to other parts of your body.  Women with genital warts should have a cervical cancer check (Pap test) at least once a year. This type of cancer is slow-growing and can be cured if found early. Chances of developing cervical cancer are increased with HPV.  Inform your obstetrician about your warts in the event of pregnancy. This virus can be passed to the baby's respiratory tract. Discuss this with your caregiver.  Use a condom during sexual intercourse. Following treatment, the use of condoms will help prevent reinfection.  Ask your caregiver about using over-the-counter anti-itch creams. SEEK MEDICAL CARE IF:   Your treated skin becomes red, swollen, or painful.  You have a fever.  You feel generally ill.  You feel little lumps in and around your genital area.  You are bleeding or have painful sexual intercourse. MAKE SURE YOU:   Understand these instructions.  Will watch your condition.  Will get help right away if you are not doing well or get worse. Document Released: 09/30/2000 Document Revised: 02/17/2014 Document Reviewed: 04/11/2011 ExitCare Patient Information 2015 [employer]. This   information is not intended to replace advice given to you by your health care provider. Make sure you discuss any questions you have with your health care provider.  

## 2014-10-08 NOTE — ED Notes (Signed)
Pt from home with abscess to labia.  Sts she first noticed it last week when she got off her period. Was advised by her mother to get it checked out when she got home from college.  Denies any drainage, fever, chills, tenderness or warmth.

## 2014-11-16 IMAGING — CR DG FOOT COMPLETE 3+V*L*
3 series · 3 of 3 positions shown · non-contrast
Comparison: 06/14/2013

CLINICAL DATA: Pain, history of 5th metatarsal fracture

EXAM:
LEFT FOOT - COMPLETE 3+ VIEW

[x foot ap left]
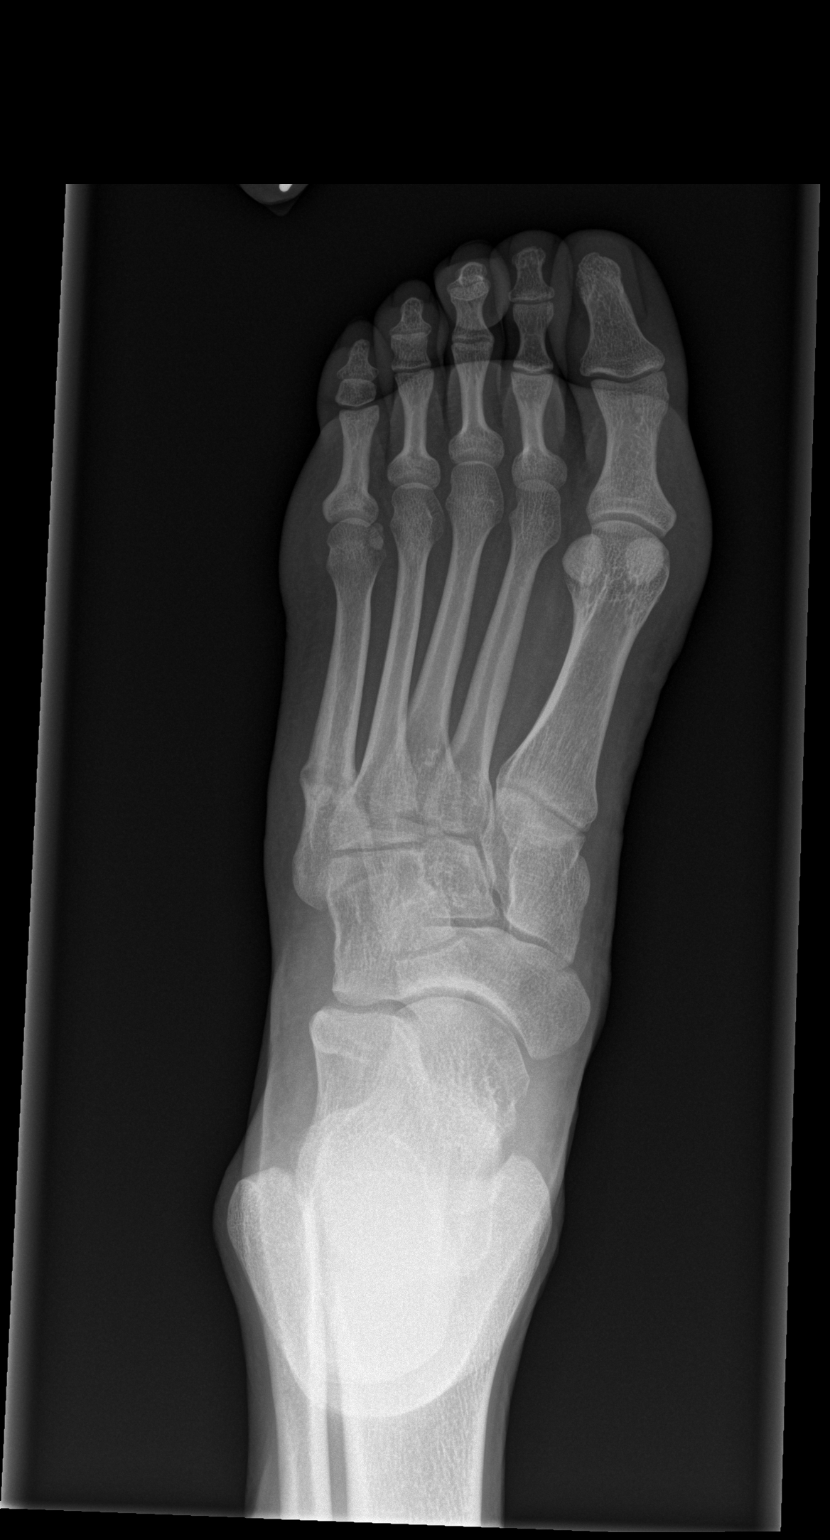

[x foot obl left]
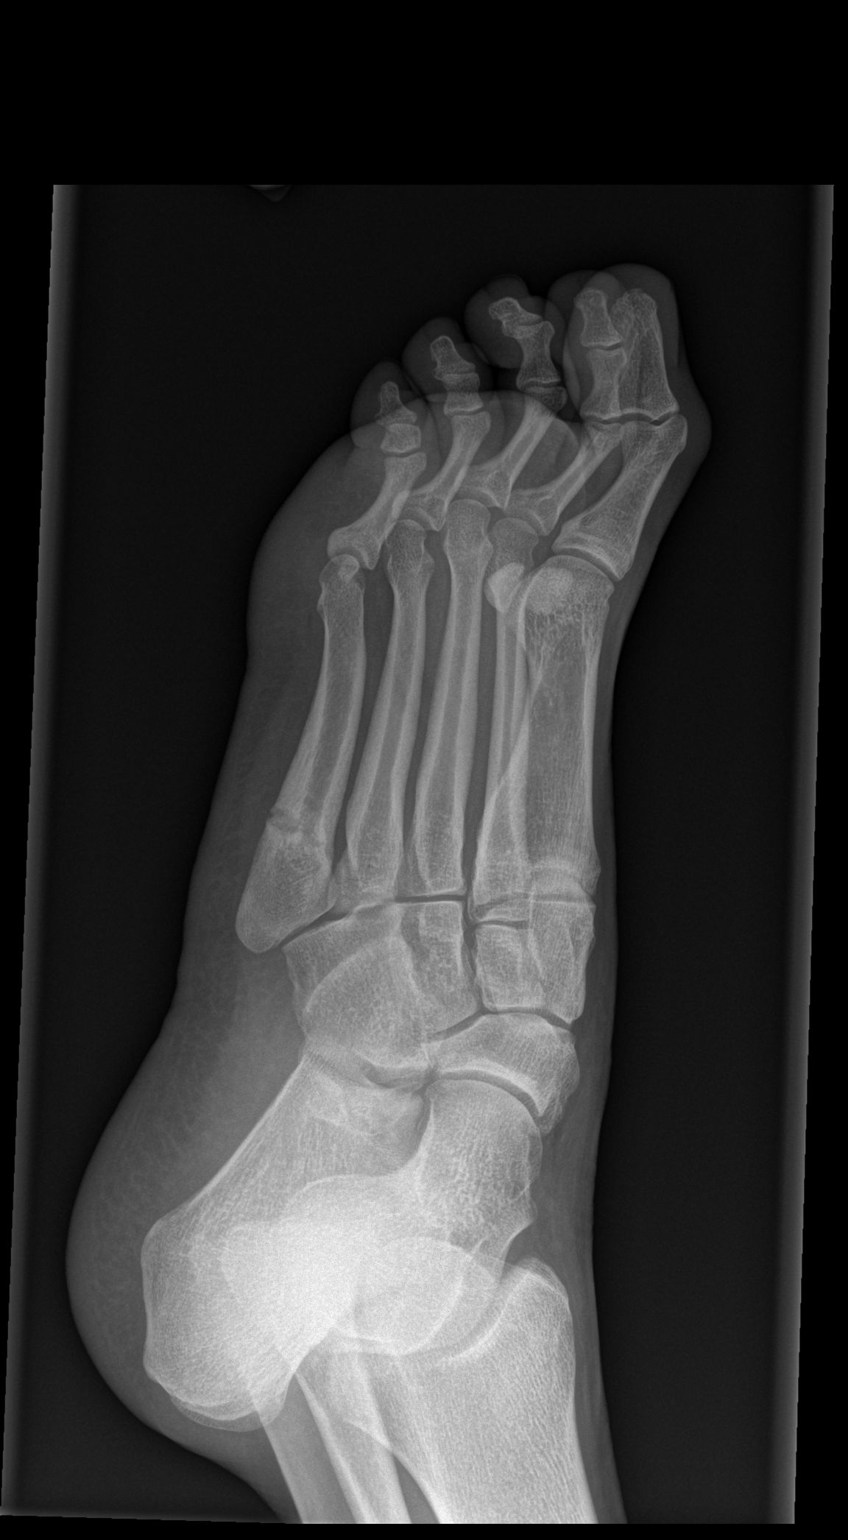

[x foot lat left]
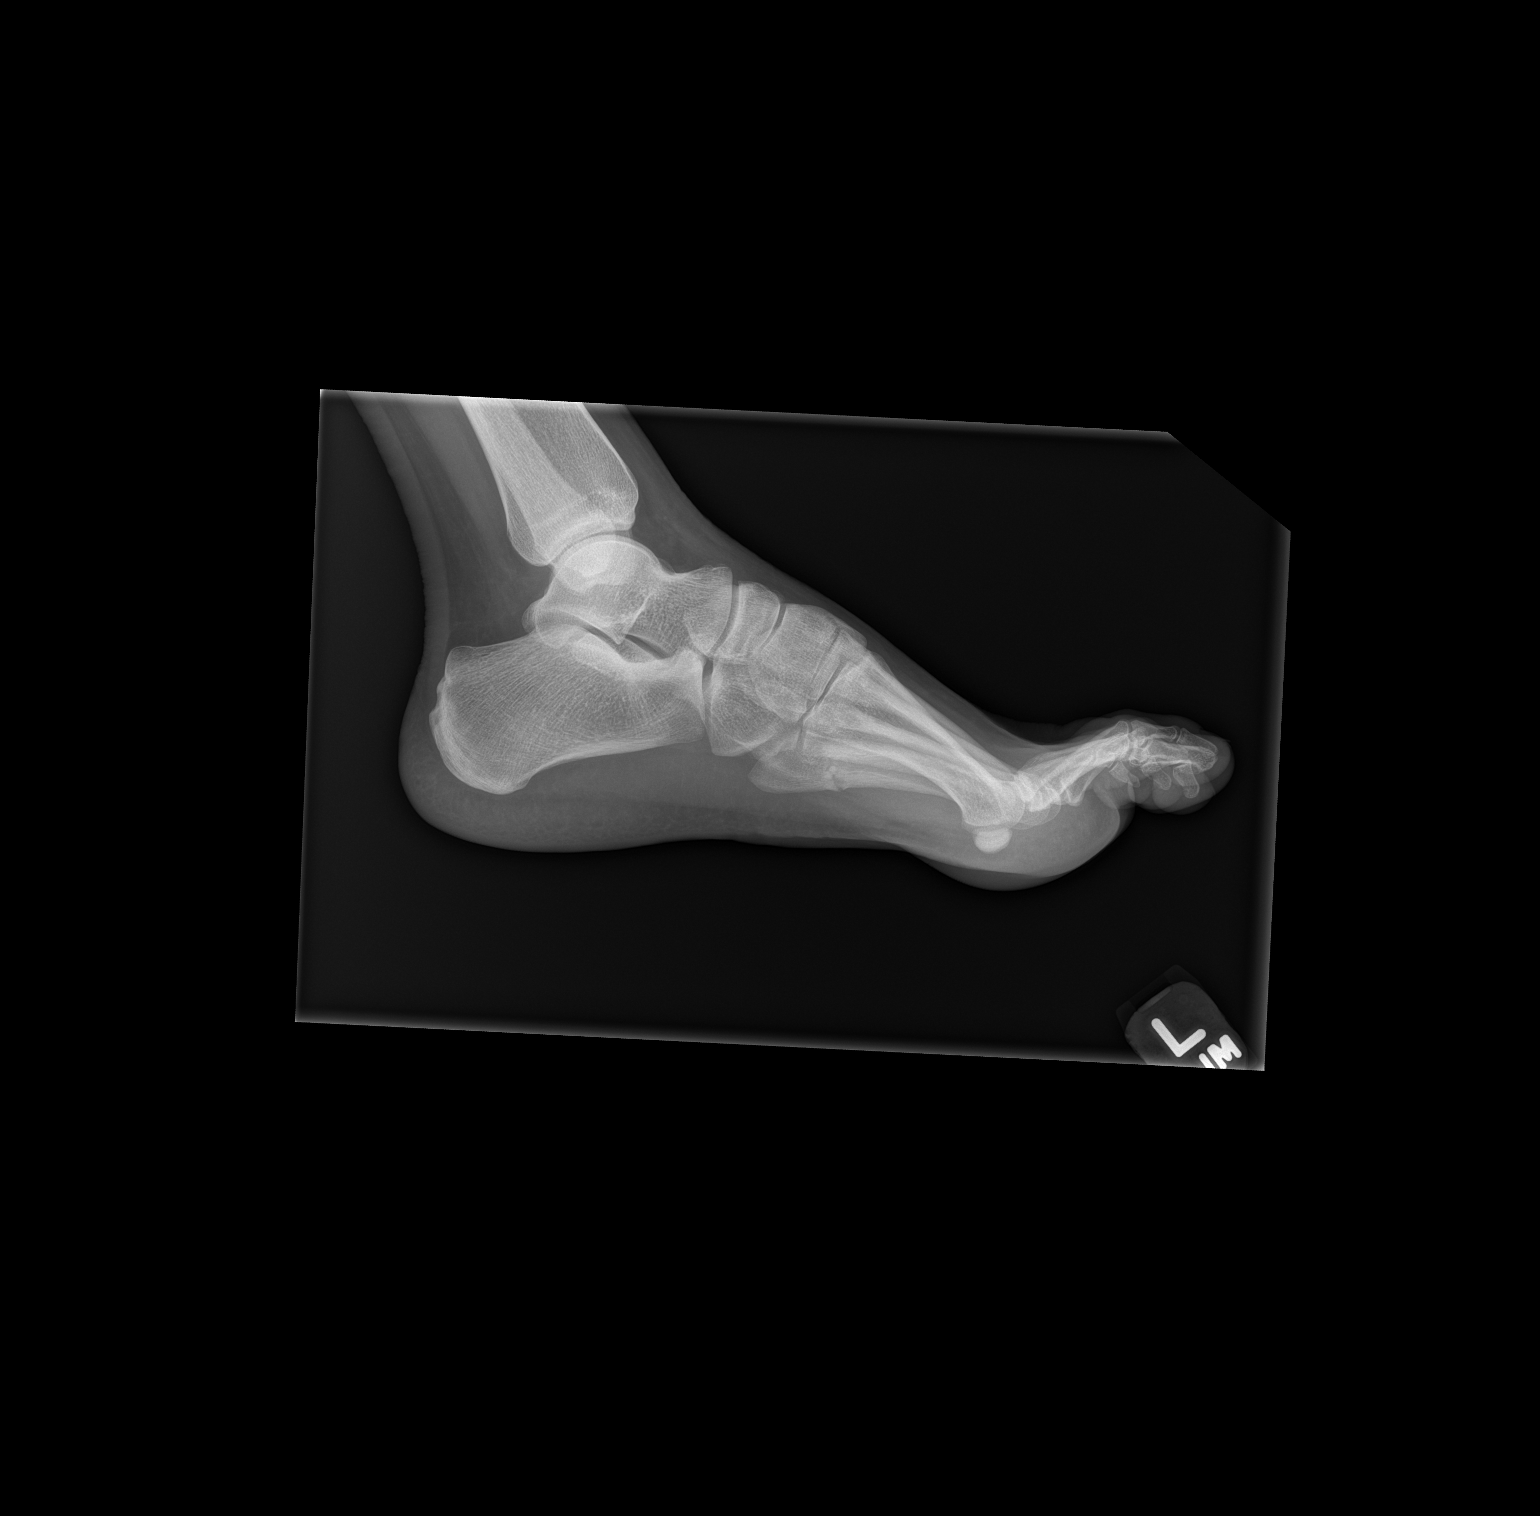

[3 of 3 positions shown; findings below may reference images not displayed]

FINDINGS: Healing fracture involving the proximal 5th metatarsal. Fracture
lucency remains visible.

The joint spaces are preserved.

Visualized soft tissues are grossly unremarkable.
IMPRESSION: Healing fracture involving the proximal 5th metatarsal. Fracture
lucency remains visible.

## 2016-02-27 ENCOUNTER — Encounter (HOSPITAL_COMMUNITY): Payer: Self-pay

## 2016-02-27 ENCOUNTER — Emergency Department (HOSPITAL_COMMUNITY)
Admission: EM | Admit: 2016-02-27 | Discharge: 2016-02-27 | Disposition: A | Discharge status: Home / Self Care | Payer: Self-pay | Attending: Emergency Medicine | Admitting: Emergency Medicine

## 2016-02-27 DIAGNOSIS — B9689 Other specified bacterial agents as the cause of diseases classified elsewhere: Secondary | ICD-10-CM

## 2016-02-27 DIAGNOSIS — Z3202 Encounter for pregnancy test, result negative: Secondary | ICD-10-CM | POA: Insufficient documentation

## 2016-02-27 DIAGNOSIS — N898 Other specified noninflammatory disorders of vagina: Secondary | ICD-10-CM

## 2016-02-27 DIAGNOSIS — N760 Acute vaginitis: Secondary | ICD-10-CM | POA: Insufficient documentation

## 2016-02-27 LAB — URINALYSIS, ROUTINE W REFLEX MICROSCOPIC
Bilirubin Urine: NEGATIVE
Glucose, UA: NEGATIVE mg/dL
Hgb urine dipstick: NEGATIVE
Ketones, ur: NEGATIVE mg/dL
NITRITE: NEGATIVE
PROTEIN: NEGATIVE mg/dL
SPECIFIC GRAVITY, URINE: 1.03 (ref 1.005–1.030)
pH: 7 (ref 5.0–8.0)

## 2016-02-27 LAB — URINE MICROSCOPIC-ADD ON

## 2016-02-27 LAB — WET PREP, GENITAL
Sperm: NONE SEEN
Trich, Wet Prep: NONE SEEN
Yeast Wet Prep HPF POC: NONE SEEN

## 2016-02-27 LAB — POC URINE PREG, ED: PREG TEST UR: NEGATIVE

## 2016-02-27 MED ORDER — METRONIDAZOLE 500 MG PO TABS: 500.0000 mg | ORAL_TABLET | Freq: Two times a day (BID) | ORAL | Status: DC

## 2016-02-27 NOTE — ED Provider Notes (Signed)
CSN: [id number]     Arrival date & time 02/27/16  0941 History   First MD Initiated Contact with Patient 02/27/16 1008     Chief Complaint  Patient presents with  . Urinary Tract Infection  . Vaginal Discharge     (Consider location/radiation/quality/duration/timing/severity/associated sxs/prior Treatment) HPI Comments: Patient is a 25 year old female who presents with vaginal discharge, vaginal itching, and malodorous urine. Patient states she began having discharge a couple weeks ago, but it resolved. It has now started again over the past week. She describes it as thin, white discharge. She reports an associated odor to discharge, as well as urine. She also has had associated skin irritation to her vaginal area, but denies any today. Patient was last sexually active one year ago. She is not on birth control. Her last menstrual period was at the end of April. Her periods are normally regular. She denies any headaches, chest pain, shortness of breath, abdominal pain, nausea, vomiting, diarrhea, dysuria.  Patient is a 25 y.o. female presenting with urinary tract infection and vaginal discharge. The history is provided by the patient.  Urinary Tract Infection Associated symptoms: vaginal discharge   Associated symptoms: no abdominal pain, no fever, no nausea and no vomiting   Vaginal Discharge Associated symptoms: no abdominal pain, no dysuria, no fever, no nausea and no vomiting     History reviewed. No pertinent past medical history. History reviewed. No pertinent past surgical history. History reviewed. No pertinent family history. Social History  Substance Use Topics  . Smoking status: Never Smoker   . Smokeless tobacco: None  . Alcohol Use: No   OB History    No data available     Review of Systems  Constitutional: Negative for fever and chills.  HENT: Negative for facial swelling and sore throat.   Respiratory: Negative for shortness of breath.   Cardiovascular: Negative  for chest pain.  Gastrointestinal: Negative for nausea, vomiting and abdominal pain.  Genitourinary: Positive for vaginal discharge. Negative for dysuria, urgency, frequency, hematuria, vaginal bleeding, vaginal pain and pelvic pain.  Musculoskeletal: Negative for back pain.  Skin: Negative for rash and wound.  Neurological: Negative for headaches.  Psychiatric/Behavioral: The patient is not nervous/anxious.       Allergies  Review of patient's allergies indicates no known allergies.  Home Medications   Prior to Admission medications   Medication Sig Start Date End Date Taking? Authorizing Provider  metroNIDAZOLE (FLAGYL) 500 MG tablet Take 1 tablet (500 mg total) by mouth 2 (two) times daily. 02/27/16   Shylo Zamor M Taitum Alms, PA-C   BP 113/71 mmHg  Pulse 56  Temp(Src) 97.6 F (36.4 C) (Oral)  Resp 16  SpO2 100%  LMP 02/14/2016 Physical Exam  Constitutional: She appears well-developed and well-nourished. No distress.  HENT:  Head: Normocephalic and atraumatic.  Mouth/Throat: Oropharynx is clear and moist. No oropharyngeal exudate.  Eyes: Conjunctivae are normal. Pupils are equal, round, and reactive to light. Right eye exhibits no discharge. Left eye exhibits no discharge. No scleral icterus.  Neck: Normal range of motion. Neck supple. No thyromegaly present.  Cardiovascular: Normal rate, regular rhythm, normal heart sounds and intact distal pulses.  Exam reveals no gallop and no friction rub.   No murmur heard. Pulmonary/Chest: Effort normal and breath sounds normal. No stridor. No respiratory distress. She has no wheezes. She has no rales.  Abdominal: Soft. Bowel sounds are normal. She exhibits no distension. There is no tenderness. There is no rebound and no guarding.  Genitourinary: There  is no rash, tenderness, lesion or injury on the right labia. There is no rash, tenderness, lesion or injury on the left labia. Cervix exhibits discharge (white/yellow with clear mucus). Cervix  exhibits no motion tenderness and no friability. Right adnexum displays no mass, no tenderness and no fullness. Left adnexum displays no mass, no tenderness and no fullness. No erythema, tenderness or bleeding in the vagina. No foreign body around the vagina. No signs of injury around the vagina. Vaginal discharge found.  No rash or irritation noted on labia and surrounding skin  Musculoskeletal: She exhibits no edema.  Lymphadenopathy:    She has no cervical adenopathy.  Neurological: She is alert. Coordination normal.  Skin: Skin is warm and dry. No rash noted. She is not diaphoretic. No pallor.  Psychiatric: She has a normal mood and affect.  Nursing note and vitals reviewed.   ED Course  Procedures (including critical care time) Labs Review Labs Reviewed  WET PREP, GENITAL - Abnormal; Notable for the following:    Clue Cells Wet Prep HPF POC PRESENT (*)    WBC, Wet Prep HPF POC MODERATE (*)    All other components within normal limits  URINALYSIS, ROUTINE W REFLEX MICROSCOPIC (NOT AT [facility]) - Abnormal; Notable for the following:    APPearance CLOUDY (*)    Leukocytes, UA SMALL (*)    All other components within normal limits  URINE MICROSCOPIC-ADD ON - Abnormal; Notable for the following:    Squamous Epithelial / LPF 6-30 (*)    Bacteria, UA MANY (*)    All other components within normal limits  URINE CULTURE  RPR  HIV ANTIBODY (ROUTINE TESTING)  POC URINE PREG, ED  GC/CHLAMYDIA PROBE AMP ([place]) NOT AT [facility]    Imaging Review No results found. I have personally reviewed and evaluated these images and lab results as part of my medical decision-making.   EKG Interpretation None      MDM   Wet prep shows bacterial vaginosis. GC/Chlamydia cultures pending and advised that they will result in 2-3 days. HIV and RPR sent. UA shows small leukocytes, many bacteria. Most likely due to BV or contamination, urine culture sent and would treat if returned positive. Patient  advised to inform and treat all sexual partners if any of her results return positive. Patient discharged with Flagyl. Patient also given advice on how to prevent BV. Patient advised to follow-up with OB/GYN for her annual exam and recheck. Pt encouraged to follow up at local health department for future STI checks. No concern for PID. Discussed return precautions. Patient vitals stable throughout ED course and in satisfactory condition at discharge.   Final diagnoses:  Vaginal discharge  Bacterial vaginosis       Emi Holes, PA-C 02/27/16 1304  Loren Racer, MD 02/28/16 [phone]

## 2016-02-27 NOTE — ED Notes (Signed)
Pt reports malodorous urine and white vaginal discharge w/ vaginal itching. Denies pain w/ urination, n/v, flank pain or other complaints.

## 2016-02-27 NOTE — Discharge Instructions (Signed)
You have been diagnosed with bacterial vaginosis today. It is recommended that you use soaps with no scent and do not douche, as this can cause bacterial vaginosis. The blood tests and cultures of your discharge and urine that were sent will return in 2-3 days and you will be called if any of your results are POSITIVE. Any treatment will be organized at that time. In the future, you can all up at the health department for free STD checks. Please follow-up with your OB/GYN doctor for your annual exam and recheck of resolution of your vaginal discharge. Please return the emergency department if you develop any new or worsening symptoms.   Bacterial Vaginosis Bacterial vaginosis is a vaginal infection that occurs when the normal balance of bacteria in the vagina is disrupted. It results from an overgrowth of certain bacteria. This is the most common vaginal infection in women of childbearing age. Treatment is important to prevent complications, especially in pregnant women, as it can cause a premature delivery. CAUSES  Bacterial vaginosis is caused by an increase in harmful bacteria that are normally present in smaller amounts in the vagina. Several different kinds of bacteria can cause bacterial vaginosis. However, the reason that the condition develops is not fully understood. RISK FACTORS Certain activities or behaviors can put you at an increased risk of developing bacterial vaginosis, including:  Having a new sex partner or multiple sex partners.  Douching.  Using an intrauterine device (IUD) for contraception. Women do not get bacterial vaginosis from toilet seats, bedding, swimming pools, or contact with objects around them. SIGNS AND SYMPTOMS  Some women with bacterial vaginosis have no signs or symptoms. Common symptoms include:  Grey vaginal discharge.  A fishlike odor with discharge, especially after sexual intercourse.  Itching or burning of the vagina and vulva.  Burning or pain  with urination. DIAGNOSIS  Your health care provider will take a medical history and examine the vagina for signs of bacterial vaginosis. A sample of vaginal fluid may be taken. Your health care provider will look at this sample under a microscope to check for bacteria and abnormal cells. A vaginal pH test may also be done.  TREATMENT  Bacterial vaginosis may be treated with antibiotic medicines. These may be given in the form of a pill or a vaginal cream. A second round of antibiotics may be prescribed if the condition comes back after treatment. Because bacterial vaginosis increases your risk for sexually transmitted diseases, getting treated can help reduce your risk for chlamydia, gonorrhea, HIV, and herpes. HOME CARE INSTRUCTIONS   Only take over-the-counter or prescription medicines as directed by your health care provider.  If antibiotic medicine was prescribed, take it as directed. Make sure you finish it even if you start to feel better.  Tell all sexual partners that you have a vaginal infection. They should see their health care provider and be treated if they have problems, such as a mild rash or itching.  During treatment, it is important that you follow these instructions:  Avoid sexual activity or use condoms correctly.  Do not douche.  Avoid alcohol as directed by your health care provider.  Avoid breastfeeding as directed by your health care provider. SEEK MEDICAL CARE IF:   Your symptoms are not improving after 3 days of treatment.  You have increased discharge or pain.  You have a fever. MAKE SURE YOU:   Understand these instructions.  Will watch your condition.  Will get help right away if you are  not doing well or get worse. FOR MORE INFORMATION  Centers for Disease Control and Prevention, Division of STD Prevention: SolutionApps.co.zawww.cdc.gov/std American Sexual Health Association (ASHA): www.ashastd.org    This information is not intended to replace advice given to you  by your health care provider. Make sure you discuss any questions you have with your health care provider.   Document Released: 10/03/2005 Document Revised: 10/24/2014 Document Reviewed: 05/15/2013 Elsevier Interactive Patient Education [employer]2016 Elsevier Inc.

## 2016-02-27 NOTE — ED Notes (Signed)
Pt verbalized understanding of d/c instructions, prescriptions, and follow-up care. No further questions/concerns, VSS, ambulatory w/ steady gait (refused wheelchair) 

## 2016-02-28 LAB — HIV ANTIBODY (ROUTINE TESTING W REFLEX): HIV Screen 4th Generation wRfx: NONREACTIVE

## 2016-02-28 LAB — URINE CULTURE: Culture: >=100000 — AB

## 2016-02-28 LAB — RPR: RPR Ser Ql: NONREACTIVE

## 2016-02-29 ENCOUNTER — Telehealth (HOSPITAL_BASED_OUTPATIENT_CLINIC_OR_DEPARTMENT_OTHER): Payer: Self-pay | Admitting: Emergency Medicine

## 2016-02-29 LAB — GC/CHLAMYDIA PROBE AMP (~~LOC~~) NOT AT ARMC
Chlamydia: NEGATIVE
Neisseria Gonorrhea: NEGATIVE

## 2016-02-29 NOTE — Telephone Encounter (Signed)
Post ED Visit - Positive Culture Follow-up  Culture report reviewed by antimicrobial stewardship pharmacist:  []  Enzo BiNathan Batchelder, Pharm.D. []  Celedonio MiyamotoJeremy Frens, Pharm.D., BCPS []  Garvin FilaMike Maccia, Pharm.D. [x]  Georgina PillionElizabeth Martin, Pharm.D., BCPS []  Parcel[place] NuevasMinh Pham, [address]Pharm.D., BCPS, AAHIVP []  Estella HuskMichelle Turner, Pharm.D., BCPS, AAHIVP []  Tennis Mustassie Stewart, [address] BoulevardPharm.D. []  Sherle Poeob Vincent, [place]arm.D.  Positive urine  culture Treated with metronidazole, organism sensitive to the same and no further patient follow-up is required at this time.  Berle MullMiller, Layann Bluett 02/29/2016, 12:19 PM

## 2016-03-27 ENCOUNTER — Emergency Department (HOSPITAL_COMMUNITY)
Admission: EM | Admit: 2016-03-27 | Discharge: 2016-03-27 | Disposition: A | Discharge status: Home / Self Care | Payer: Self-pay | Attending: Emergency Medicine | Admitting: Emergency Medicine

## 2016-03-27 ENCOUNTER — Encounter (HOSPITAL_COMMUNITY): Payer: Self-pay

## 2016-03-27 DIAGNOSIS — B379 Candidiasis, unspecified: Secondary | ICD-10-CM | POA: Insufficient documentation

## 2016-03-27 DIAGNOSIS — N760 Acute vaginitis: Secondary | ICD-10-CM | POA: Insufficient documentation

## 2016-03-27 DIAGNOSIS — Z79899 Other long term (current) drug therapy: Secondary | ICD-10-CM | POA: Insufficient documentation

## 2016-03-27 DIAGNOSIS — B9689 Other specified bacterial agents as the cause of diseases classified elsewhere: Secondary | ICD-10-CM | POA: Insufficient documentation

## 2016-03-27 LAB — URINE MICROSCOPIC-ADD ON: RBC / HPF: NONE SEEN RBC/hpf (ref 0–5)

## 2016-03-27 LAB — URINALYSIS, ROUTINE W REFLEX MICROSCOPIC
Bilirubin Urine: NEGATIVE
GLUCOSE, UA: NEGATIVE mg/dL
HGB URINE DIPSTICK: NEGATIVE
KETONES UR: 15 mg/dL — AB
Nitrite: NEGATIVE
PH: 5.5 (ref 5.0–8.0)
PROTEIN: NEGATIVE mg/dL
Specific Gravity, Urine: 1.024 (ref 1.005–1.030)

## 2016-03-27 LAB — WET PREP, GENITAL
SPERM: NONE SEEN
TRICH WET PREP: NONE SEEN

## 2016-03-27 LAB — PREGNANCY, URINE: Preg Test, Ur: NEGATIVE

## 2016-03-27 MED ORDER — METRONIDAZOLE 500 MG PO TABS: 500.0000 mg | ORAL_TABLET | Freq: Two times a day (BID) | ORAL | Status: DC

## 2016-03-27 MED ORDER — FLUCONAZOLE 100 MG PO TABS
150.0000 mg | ORAL_TABLET | Freq: Once | ORAL | Status: AC
  Administered 2016-03-27: 150 mg via ORAL
  Filled 2016-03-27: qty 2

## 2016-03-27 NOTE — Discharge Instructions (Signed)
Read the information below.   You have a yeast infection and bacterial vaginosis. You were given an antifungal in the ED. You were prescribed an antibiotic. Be sure to take as prescribed.  Your blood pressure was elevated today. It is important that you track your blood pressure, because elevated blood pressure can lead to heart disease, kidney disease, and increase your risk of stroke. You can check your blood pressures at the pharmacy. Be sure to discuss blood pressure readings with your PCP.  Use the prescribed medication as directed.  Please discuss all new medications with your pharmacist.   It is important to follow up with a PCP, I have provided the contact information for [facility] and Wellness. I have also provided additional resources for other local clinics. Be sure to follow up within one week.  You may return to the Emergency Department at any time for worsening condition or any new symptoms that concern you. Return to ED if you develop fever, vaginal pain, pelvic pain, vaginal bleeding outside of menstrual cycle, blood in urine, or blood in stool.   [employer] 211 is a great source of information about community services available.  Access by dialing 2-1-1 from anywhere in [place], or by website -  PooledIncome.pl.   Other Local Resources (Updated 10/2015)  Financial Assistance   Services    Phone Number and Address  [facility]  Low-cost medical care - 1st and 3rd Saturday of every month  Must not qualify for public or private insurance and must have limited income [phone] [address] [place], [place]    [place] [employer] of Social Services  Child care  Emergency assistance for housing and [employer]  Medicaid [phone] [address] [place], [place] [address]   [facility] Department  Low-cost medical care for children, communicable  diseases, sexually-transmitted diseases, immunizations, maternity care, womens health and family planning [phone] [address] [place], [place] [address]  [facility]   Medication assistance for [facility] residents  Must meet income requirements [phone] [address] [place], [place].    [facility] Social Services  Child care  Emergency assistance for housing and [employer]  Medicaid [phone] [address] [place], [place] [address]  [facility]   Low-cost medical care,   Monday through Friday, 9 am to 6 pm.   Accepts Medicare/Medicaid, and self-pay [phone] [address] [place], [place] [address]  [facility]  Low-cost medical care - Monday through Friday, 8:30 am - 5:30 pm  Accepts Medicaid and self-pay [phone] [address] [place], [place] [address]   [facility]  Primary medical care, including for those with sickle cell disease  Accepts Medicare, Medicaid, insurance and self-pay [phone] [address] [place], [place]  [facility]   Primary medical care  Accepts Medicare, [place], insurance and self-pay [phone] [address] [place], [place] [address]   [facility] Department of Social Services  Child care  Emergency assistance for housing and [employer]  Medicaid [phone] [address] [place], [place] [address]  [facility] Department of Health and [employer]  Child care  Emergency assistance for housing and [employer]  Medicaid [phone] [address] [place], [place] [address]   [facility] Medication Assistance Program  Medication assistance for [facility] residents with no insurance only  Must have a primary care doctor [phone] [address] [place], [place]  [facility]   Primary medical care  [place], [place], insurance  [phone] [address] [place], [place]  [facility]   Medication assistance [phone]  [facility]   Primary medical care  Accepts Medicare, [place], insurance and self-pay [phone] [address] [place], [place] [address]  [facility]   Primary medical care  Accepts Medicare, [place], insurance and self-pay [phone] [address] [place], [place] [address]  [facility]  For [place] residents between the ages of 58 and 72 who do not have any form of health insurance, Medicare, [place], or [place] benefits.  Services are provided free of charge to uninsured patients who fall within federal poverty guidelines.    Hours: Tuesdays and Thursdays, 4:15 - 8 pm [phone] [address] [place], [place] [address]  [facility]     Primary medical care  Dental care  Nutritional counseling  Pharmacy  Accepts Medicaid, Medicare, most insurance.  Fees are adjusted based on ability to pay.   [phone] [facility] [address] [place], [place]  [phone] [facility] [address] [place], [place]  [phone] [facility] [place], [place]  [phone] [facility], [address] [place], [place]  [phone] [facility] [address] [place], [place]  [facility]  Womens health and family planning [phone] [address] [place], [place]  [facility] Department of Social Services  Child care  Emergency assistance for housing and [employer]  Medicaid [phone] [address], [place], [place] [address]   [facility]    Ages 63 and older  Hours: Mondays and  Thursdays, 7:00 am - 9:00 am Patients are seen on a first come, first served basis. [phone], ext. [phone] [place], [place]  [facility] Division of Social Services  Child care  Emergency assistance for housing and [employer]  Medicaid [phone] [address] [place], [place] [address]  [facility]  Medication assistance  Rental assistance  Food pantry  Medication assistance  Housing assistance  Emergency food distribution  Utility assistance [phone] [address] [place], [place]  [phone]  [address] [place], [place] [address] Hours: Tuesdays and Thursdays from 9am - 12 noon by appointment only  [phone] [address] [place], [place] [address]  [facility] - Lanae Boast   Accepts private insurance, [place], and [place].  Payment is based on a sliding scale for those without insurance.  Hours: Mondays, Tuesdays and Thursdays, 8:30 am - 5:30 pm.   [phone] [address] [place], [place]  [facility] - Family Medicine at [facility], [place], and [place].  Payment is based on a sliding scale for those without insurance. [phone] [address] [place], [place]  [facility] - Pediatrics at E. Scientist, research (physical sciences), [employer], and [place].  Payment is based on a sliding scale for those without insurance [phone] [address], [employer], [place]  [facility] - Pediatrics at [employer], [place], and [place].  Payment is based on a sliding scale for those without  insurance. [phone] [address] [place], [place]  [facility] - [facility]s at [facility]  Accepts private insurance, [place], and [place].  Payment is based on a sliding scale for those without insurance.  [phone], [phone] [place], [place].    [facility]  Maternity care.  Accepts Medicaid and self-pay. [phone] [address] [place], [place]    Bacterial Vaginosis Bacterial vaginosis is an infection of the vagina. It happens when too many germs (bacteria) grow in the vagina. Having this infection puts you at risk for getting other infections from sex. Treating this infection can help lower your risk for other infections, such as:   Chlamydia.  Gonorrhea.  HIV.  Herpes. HOME CARE  Take your medicine as told by your doctor.  Finish your medicine even if you start to feel better.  Tell your sex partner that you have an infection. They should see their doctor for treatment.  During treatment:  Avoid sex or use condoms correctly.  Do not douche.  Do not drink alcohol unless your doctor tells you it is ok.  Do not breastfeed unless your doctor tells you it is ok. GET HELP IF:  You are not getting better after 3 days of treatment.  You have more grey fluid (discharge) coming from your vagina than before.  You have more pain than before.  You have a fever. MAKE SURE YOU:   Understand these instructions.  Will watch your condition.  Will get help right away if you are not doing well or get worse.   This information is not intended to replace advice given to you by your health care provider. Make sure you discuss any questions you have with your health care provider.   Document Released: 07/12/2008 Document Revised: 10/24/2014 Document Reviewed: 05/15/2013 Elsevier Interactive Patient Education [employer]2016 Elsevier Inc.

## 2016-03-27 NOTE — ED Notes (Signed)
Patient complains of recurrent vaginal discharge. Stats that she was treated for BV in May and now has discharge that started again ysterday

## 2016-03-27 NOTE — ED Provider Notes (Signed)
CSN: [id number]     Arrival date & time 03/27/16  1455 History   First MD Initiated Contact with Patient 03/27/16 1547     Chief Complaint  Patient presents with  . Vaginal Discharge     (Consider location/radiation/quality/duration/timing/severity/associated sxs/prior Treatment) HPI Comments: Beth Lindsey is a 25 y.o. Female G1P0010 presents to ED with complaint of vaginal discharge. She states vaginal discharge started yesterday and is yellow/white in color with associated itching. She denies vaginal pain, vaginal bleeding, pelvic pain, abdominal pain, nausea, vomiting, diarrhea, constipation, dysuria, and hematuria. No fever, chills, or night sweats. She denies sexual activity in last 6 months. Her LMP was in May, unsure date. She has a regular cycle. She had a D&C in July 2016, otherwise denies gynecologic complaints or procedures. No history of abdominal complaints or surgeries. She was seen approximately one month ago for similar symptoms and found to have BV, per pt, she was treated for 6 days of ABX.   Patient is a 25 y.o. female presenting with vaginal discharge. The history is provided by the patient.  Vaginal Discharge Associated symptoms: no abdominal pain, no dysuria, no fever, no nausea and no vomiting     History reviewed. No pertinent past medical history. History reviewed. No pertinent past surgical history. No family history on file. Social History  Substance Use Topics  . Smoking status: Never Smoker   . Smokeless tobacco: None  . Alcohol Use: No   OB History    No data available     Review of Systems  Constitutional: Negative for fever, chills and diaphoresis.  Eyes: Negative for visual disturbance.  Gastrointestinal: Negative for nausea, vomiting, abdominal pain, diarrhea and constipation.  Genitourinary: Positive for vaginal discharge. Negative for dysuria, hematuria, vaginal bleeding, vaginal pain and pelvic pain.  Neurological: Negative for dizziness,  weakness, light-headedness, numbness and headaches.      Allergies  Review of patient's allergies indicates no known allergies.  Home Medications   Prior to Admission medications   Medication Sig Start Date End Date Taking? Authorizing Provider  metroNIDAZOLE (FLAGYL) 500 MG tablet Take 1 tablet (500 mg total) by mouth 2 (two) times daily. 03/27/16   Lona KettleAshley Laurel Meyer, PA-C   BP 150/83 mmHg  Pulse 70  Temp(Src) 97.8 F (36.6 C) (Oral)  Resp 18  SpO2 100%  LMP 03/15/2016 Physical Exam  Constitutional: She appears well-developed and well-nourished. No distress.  HENT:  Head: Normocephalic and atraumatic.  Mouth/Throat: Oropharynx is clear and moist. No oropharyngeal exudate.  Eyes: Conjunctivae are normal. Pupils are equal, round, and reactive to light. No scleral icterus.  Neck: Normal range of motion.  Cardiovascular: Normal rate, regular rhythm and normal heart sounds.   No murmur heard. Pulmonary/Chest: Effort normal and breath sounds normal. No respiratory distress. She has no wheezes.  Abdominal: Soft. Bowel sounds are normal. There is no tenderness. There is no rebound and no guarding.  Genitourinary: Vagina normal. Pelvic exam was performed with patient supine. There is no rash, tenderness, lesion or injury on the right labia. There is no rash, tenderness, lesion or injury on the left labia. Cervix exhibits discharge. Cervix exhibits no motion tenderness and no friability. Right adnexum displays no mass and no tenderness. Left adnexum displays no mass and no tenderness.  Chaperone present for duration of exam. External anatomy normal - no injury, lesions, masses, or rashes. No bleeding, lesions, masses, or ulcerations in vaginal cavity. Cervix is closed with thick white discharge. No friability. No CMT, no adnexal  tenderness, no masses palpated on bimanual exam.   Neurological: She is alert. Coordination normal.  Skin: Skin is warm and dry. She is not diaphoretic.   Psychiatric: She has a normal mood and affect. Her behavior is normal.    ED Course  Procedures (including critical care time) Labs Review Labs Reviewed  WET PREP, GENITAL - Abnormal; Notable for the following:    Yeast Wet Prep HPF POC PRESENT (*)    Clue Cells Wet Prep HPF POC PRESENT (*)    WBC, Wet Prep HPF POC FEW (*)    All other components within normal limits  URINALYSIS, ROUTINE W REFLEX MICROSCOPIC (NOT AT [facility]) - Abnormal; Notable for the following:    APPearance CLOUDY (*)    Ketones, ur 15 (*)    Leukocytes, UA MODERATE (*)    All other components within normal limits  URINE MICROSCOPIC-ADD ON - Abnormal; Notable for the following:    Squamous Epithelial / LPF 6-30 (*)    Bacteria, UA MANY (*)    All other components within normal limits  URINE CULTURE  PREGNANCY, URINE  GC/CHLAMYDIA PROBE AMP ([place]) NOT AT [facility]    Imaging Review No results found. I have personally reviewed and evaluated these images and lab results as part of my medical decision-making.   EKG Interpretation None      MDM   Final diagnoses:  Yeast infection  Bacterial vaginosis   Patient is afebrile and non-toxic appearing. She is resting comfortably in bed. Her blood pressure is mildly elevated - denies dizziness, LH, numbness, weakness, changes in vision, or headache. Pregnancy test negative. U/A remarkable for yeast; she has leukocytes and bacteria as well, however, denies dysuria or hematuria. Many squamous epithelial cells present, suspect specimen contamination. Will culture. Wet prep positive for yeast and clue cells. Will treat yeast infxn in ED. Rx for metronidazole for BV.   Discussed results with patient. Repeat BP improved to 110/93. Encouraged follow up with primary care provider for re-evaluation, provided resources for establishing care. Return precautions provided. Patient voiced understanding and is agreeable.     Lona Kettle, [place] 03/27/16 1925  Jacalyn Lefevre, MD 03/27/16 [id number]

## 2016-03-27 NOTE — ED Notes (Signed)
Pelvic cart set up outside of room. 

## 2016-03-27 NOTE — ED Notes (Signed)
Patient able to ambulate independently  

## 2016-03-28 LAB — URINE CULTURE

## 2016-03-28 LAB — GC/CHLAMYDIA PROBE AMP (~~LOC~~) NOT AT ARMC
CHLAMYDIA, DNA PROBE: NEGATIVE
NEISSERIA GONORRHEA: NEGATIVE

## 2016-10-09 ENCOUNTER — Emergency Department (HOSPITAL_COMMUNITY)
Admission: EM | Admit: 2016-10-09 | Discharge: 2016-10-09 | Disposition: A | Discharge status: Home / Self Care | Payer: Self-pay | Attending: Emergency Medicine | Admitting: Emergency Medicine

## 2016-10-09 ENCOUNTER — Encounter (HOSPITAL_COMMUNITY): Payer: Self-pay | Admitting: Emergency Medicine

## 2016-10-09 DIAGNOSIS — B9689 Other specified bacterial agents as the cause of diseases classified elsewhere: Secondary | ICD-10-CM | POA: Insufficient documentation

## 2016-10-09 DIAGNOSIS — N760 Acute vaginitis: Secondary | ICD-10-CM | POA: Insufficient documentation

## 2016-10-09 LAB — WET PREP, GENITAL
Sperm: NONE SEEN
TRICH WET PREP: NEGATIVE — AB
YEAST WET PREP: NEGATIVE — AB

## 2016-10-09 LAB — URINALYSIS, ROUTINE W REFLEX MICROSCOPIC
BILIRUBIN URINE: NEGATIVE
Glucose, UA: NEGATIVE mg/dL
HGB URINE DIPSTICK: NEGATIVE
Ketones, ur: NEGATIVE mg/dL
Leukocytes, UA: NEGATIVE
NITRITE: NEGATIVE
PROTEIN: NEGATIVE mg/dL
Specific Gravity, Urine: 1.015 (ref 1.005–1.030)
pH: 7 (ref 5.0–8.0)

## 2016-10-09 LAB — I-STAT BETA HCG BLOOD, ED (MC, WL, AP ONLY): I-stat hCG, quantitative: <5 m[IU]/mL (ref <5)

## 2016-10-09 LAB — HIV ANTIBODY (ROUTINE TESTING W REFLEX): HIV SCREEN 4TH GENERATION: NONREACTIVE

## 2016-10-09 LAB — RPR: RPR Ser Ql: NONREACTIVE

## 2016-10-09 MED ORDER — METRONIDAZOLE 500 MG PO TABS: 500.0000 mg | ORAL_TABLET | Freq: Two times a day (BID) | ORAL | 0 refills | Status: AC

## 2016-10-09 NOTE — ED Notes (Signed)
ED Provider at bedside. 

## 2016-10-09 NOTE — Discharge Instructions (Signed)
Take antibiotic as prescribed until all gone. Avoid any scented personal products. Wash with unscented mild soap and water. Follow up with primary care doctor.

## 2016-10-09 NOTE — ED Triage Notes (Signed)
Pt reports vaginal odor similar to previous bout of bacterial vaginosis and is concerned that it may have returned. States was seen in ED twice for same over the summer. This episode started week before last.

## 2016-10-09 NOTE — ED Provider Notes (Signed)
10:33 AM Patient was earlier seen by Dr. Preston FleetingGlick for vaginal discharge. Patient's wet prep came back with many white blood cells and clue cells present. Based on Dr. Reynolds BowlGlick's note and his pelvic exam, patient had no cervical motion tenderness and she denies any pain, fever, chills. She is concerned about bacterial vaginosis which she has had in the past. Based on these results and examination, will treat with Bactrim vaginosis. Gonorrhea and Chlamydia cultures are pending. RPR and HIV pending as well. Discussed results with patient, all questions answered. Return precautions discussed.  Vitals:   10/09/16 0558 10/09/16 0645 10/09/16 0700 10/09/16 0745  BP: 123/83 127/99 110/77 124/91  Pulse: (!) 56 62 (!) 56 72  Resp: 16     Temp:      TempSrc:      SpO2: 100% 96% 100% 100%  Weight:      Height:          Beth Crumbleatyana Adonay Scheier, PA-C 10/09/16 1036    Linwood DibblesJon Knapp, MD 10/09/16 1536

## 2016-10-09 NOTE — ED Provider Notes (Signed)
[facility]-EMERGENCY DEPT Provider Note   CSN: [id number] Arrival date & time: 10/09/16  0200     History   Chief Complaint Chief Complaint  Patient presents with  . Vaginal odor    no other s/s    HPI Beth Lindsey is a 25 y.o. female.  She is concerned that she has bacterial vaginosis. She relates that she was diagnosed with bacterial vaginosis last summer and had a vaginal odor at that time. She noted a vaginal odor following her menses about 11 days ago. The odor has resolved since then but she is concerned that she may have bacterial vaginosis. She denies any vaginal discharge. She denies any pelvic pain. She does have some muscular back pain which she relates to the job that she does which involves a lot of lifting. She denies any unprotected sex but states she is not using any kind of contraception.   The history is provided by the patient.    History reviewed. No pertinent past medical history.  There are no active problems to display for this patient.   History reviewed. No pertinent surgical history.  OB History    No data available       Home Medications    Prior to Admission medications   Medication Sig Start Date End Date Taking? Authorizing Provider  metroNIDAZOLE (FLAGYL) 500 MG tablet Take 1 tablet (500 mg total) by mouth 2 (two) times daily. 03/27/16   Lona KettleAshley Laurel Meyer, PA-C    Family History History reviewed. No pertinent family history.  Social History Social History  Substance Use Topics  . Smoking status: Never Smoker  . Smokeless tobacco: Not on file  . Alcohol use No     Allergies   Patient has no known allergies.   Review of Systems Review of Systems  All other systems reviewed and are negative.    Physical Exam Updated Vital Signs BP 123/83 (BP Location: Left Arm)   Pulse (!) 56   Temp 97.7 F (36.5 C) (Oral)   Resp 16   Ht 6\' 1"  (1.854 m)   Wt 135 lb (61.2 kg)   LMP 10/05/2016 (Approximate)   SpO2 100%   BMI 17.81  kg/m   Physical Exam  Nursing note and vitals reviewed.  25 year old female, resting comfortably and in no acute distress. Vital signs are significant for mild bradycardia. Oxygen saturation is 100%, which is normal. Head is normocephalic and atraumatic. PERRLA, EOMI. Oropharynx is clear. Neck is nontender and supple without adenopathy or JVD. Back is nontender and there is no CVA tenderness. Lungs are clear without rales, wheezes, or rhonchi. Chest is nontender. Heart has regular rate and rhythm without murmur. Abdomen is soft, flat, nontender without masses or hepatosplenomegaly and peristalsis is normoactive. Pelvic: Normal external female genitalia. Small amount of foamy white discharge present. Cervix is closed. No bleeding. No cervical motion tenderness. Fundus is normal size and position. No adnexal masses or tenderness. Extremities have no cyanosis or edema, full range of motion is present. Skin is warm and dry without rash. Neurologic: Mental status is normal, cranial nerves are intact, there are no motor or sensory deficits.  ED Treatments / Results  Labs (all labs ordered are listed, but only abnormal results are displayed) Labs Reviewed  WET PREP, GENITAL - Abnormal; Notable for the following:       Result Value   Yeast Wet Prep HPF POC NEGATIVE (*)    Trich, Wet Prep NEGATIVE (*)    Clue  Cells Wet Prep HPF POC PRESENT (*)    WBC, Wet Prep HPF POC MANY (*)    All other components within normal limits  RPR  HIV ANTIBODY (ROUTINE TESTING)  URINALYSIS, ROUTINE W REFLEX MICROSCOPIC  I-STAT BETA HCG BLOOD, ED ([facility], [facility], [facility] ONLY)  GC[facility]AM[facility] AMP [facility]    Procedures Procedures (including critical care time)  Medications Ordered in ED Medications - No data to display   Initial Impression / Assessment and Plan / ED Course  I have reviewed the triage vital signs and the nursing notes.  Pertinent lab results that were available during my care  of the patient were reviewed by me and considered in my medical decision making (see chart for details).  Clinical Course    Vaginal odor which has resolved. Old records are reviewed and she was seen in the ED in June with diagnosis of bacterial vaginosis, and also in May.  Wet prep shows clue cells. She is discharged with a prescription for metronidazole.  Final Clinical Impressions(s) / ED Diagnoses   Final diagnoses:  Bacterial vaginosis    New Prescriptions Discharge Medication List as of 10/09/2016 10:35 AM    START taking these medications   Details  metroNIDAZOLE (FLAGYL) 500 MG tablet Take 1 tablet (500 mg total) by mouth 2 (two) times daily., Starting Sun 10/09/2016, Print         Dione Boozeavid Regan Llorente, MD 10/09/16 2245

## 2016-10-09 NOTE — ED Notes (Signed)
This RN spoke with Dr. Lynelle DoctorKnapp and [facility] regarding plan of care for patient.

## 2016-10-11 LAB — GC/CHLAMYDIA PROBE AMP (~~LOC~~) NOT AT ARMC
Chlamydia: NEGATIVE
NEISSERIA GONORRHEA: NEGATIVE
